# Patient Record
Sex: Female | Born: 1950 | Hispanic: Yes | Marital: Married | Smoking: Never smoker
Health system: Southern US, Community
[De-identification: ages and names within clinical notes are randomized; demographics above are authoritative.]

## PROBLEM LIST (undated history)

## (undated) DIAGNOSIS — H269 Unspecified cataract: Secondary | ICD-10-CM

## (undated) DIAGNOSIS — E079 Disorder of thyroid, unspecified: Secondary | ICD-10-CM

## (undated) DIAGNOSIS — M1990 Unspecified osteoarthritis, unspecified site: Secondary | ICD-10-CM

## (undated) DIAGNOSIS — K649 Unspecified hemorrhoids: Secondary | ICD-10-CM

## (undated) DIAGNOSIS — B009 Herpesviral infection, unspecified: Secondary | ICD-10-CM

## (undated) DIAGNOSIS — F419 Anxiety disorder, unspecified: Secondary | ICD-10-CM

## (undated) HISTORY — DX: Anxiety disorder, unspecified: F41.9

## (undated) HISTORY — DX: Unspecified hemorrhoids: K64.9

## (undated) HISTORY — DX: Herpesviral infection, unspecified: B00.9

## (undated) HISTORY — PX: OVARIAN CYST SURGERY: SHX726

## (undated) HISTORY — DX: Unspecified osteoarthritis, unspecified site: M19.90

## (undated) HISTORY — DX: Unspecified cataract: H26.9

## (undated) HISTORY — PX: HEMORRHOID SURGERY: SHX153

## (undated) HISTORY — PX: UPPER GASTROINTESTINAL ENDOSCOPY: SHX188

---

## 2018-04-05 ENCOUNTER — Encounter (HOSPITAL_COMMUNITY): Payer: Self-pay

## 2018-04-05 ENCOUNTER — Emergency Department (HOSPITAL_COMMUNITY): Payer: Self-pay

## 2018-04-05 ENCOUNTER — Emergency Department (HOSPITAL_COMMUNITY)
Admission: EM | Admit: 2018-04-05 | Discharge: 2018-04-05 | Disposition: A | Discharge status: Home / Self Care | Payer: Self-pay | Attending: Emergency Medicine | Admitting: Emergency Medicine

## 2018-04-05 DIAGNOSIS — K5792 Diverticulitis of intestine, part unspecified, without perforation or abscess without bleeding: Secondary | ICD-10-CM | POA: Insufficient documentation

## 2018-04-05 DIAGNOSIS — Z79899 Other long term (current) drug therapy: Secondary | ICD-10-CM | POA: Insufficient documentation

## 2018-04-05 DIAGNOSIS — Z7982 Long term (current) use of aspirin: Secondary | ICD-10-CM | POA: Insufficient documentation

## 2018-04-05 HISTORY — DX: Disorder of thyroid, unspecified: E07.9

## 2018-04-05 LAB — URINALYSIS, ROUTINE W REFLEX MICROSCOPIC
BACTERIA UA: NONE SEEN
BILIRUBIN URINE: NEGATIVE
Glucose, UA: NEGATIVE mg/dL
Hgb urine dipstick: NEGATIVE
Ketones, ur: NEGATIVE mg/dL
Nitrite: NEGATIVE
PROTEIN: NEGATIVE mg/dL
Specific Gravity, Urine: 1.004 — ABNORMAL LOW (ref 1.005–1.030)
pH: 6 (ref 5.0–8.0)

## 2018-04-05 LAB — COMPREHENSIVE METABOLIC PANEL
ALT: 22 U/L (ref 0–44)
AST: 24 U/L (ref 15–41)
Albumin: 3.9 g/dL (ref 3.5–5.0)
Alkaline Phosphatase: 100 U/L (ref 38–126)
Anion gap: 10 (ref 5–15)
BUN: 11 mg/dL (ref 8–23)
CHLORIDE: 102 mmol/L (ref 98–111)
CO2: 21 mmol/L — ABNORMAL LOW (ref 22–32)
Calcium: 9.1 mg/dL (ref 8.9–10.3)
Creatinine, Ser: 0.76 mg/dL (ref 0.44–1.00)
GFR calc Af Amer: >60 mL/min (ref >60)
Glucose, Bld: 108 mg/dL — ABNORMAL HIGH (ref 70–99)
POTASSIUM: 3.8 mmol/L (ref 3.5–5.1)
Sodium: 133 mmol/L — ABNORMAL LOW (ref 135–145)
Total Bilirubin: 1.2 mg/dL (ref 0.3–1.2)
Total Protein: 7.2 g/dL (ref 6.5–8.1)

## 2018-04-05 LAB — CBC
HEMATOCRIT: 44 % (ref 36.0–46.0)
HEMOGLOBIN: 14.4 g/dL (ref 12.0–15.0)
MCH: 29.7 pg (ref 26.0–34.0)
MCHC: 32.7 g/dL (ref 30.0–36.0)
MCV: 90.7 fL (ref 78.0–100.0)
Platelets: 253 10*3/uL (ref 150–400)
RBC: 4.85 MIL/uL (ref 3.87–5.11)
RDW: 12.2 % (ref 11.5–15.5)
WBC: 10.2 10*3/uL (ref 4.0–10.5)

## 2018-04-05 LAB — LIPASE, BLOOD: LIPASE: 33 U/L (ref 11–51)

## 2018-04-05 MED ORDER — CIPROFLOXACIN HCL 500 MG PO TABS
500.0000 mg | ORAL_TABLET | Freq: Once | ORAL | Status: AC
  Administered 2018-04-05: 500 mg via ORAL
  Filled 2018-04-05: qty 1

## 2018-04-05 MED ORDER — SODIUM CHLORIDE 0.9 % IV BOLUS
500.0000 mL | Freq: Once | INTRAVENOUS | Status: AC
Start: 2018-04-05 — End: 2018-04-05
  Administered 2018-04-05: 500 mL via INTRAVENOUS

## 2018-04-05 MED ORDER — METRONIDAZOLE 500 MG PO TABS: 500.0000 mg | ORAL_TABLET | Freq: Three times a day (TID) | ORAL | 0 refills | Status: AC

## 2018-04-05 MED ORDER — CIPROFLOXACIN HCL 500 MG PO TABS: 500.0000 mg | ORAL_TABLET | Freq: Two times a day (BID) | ORAL | 0 refills | Status: AC

## 2018-04-05 MED ORDER — IOHEXOL 300 MG/ML  SOLN
100.0000 mL | Freq: Once | INTRAMUSCULAR | Status: AC | PRN
  Administered 2018-04-05: 100 mL via INTRAVENOUS

## 2018-04-05 MED ORDER — TRAMADOL HCL 50 MG PO TABS: 50.0000 mg | ORAL_TABLET | Freq: Four times a day (QID) | ORAL | 0 refills | Status: DC | PRN

## 2018-04-05 MED ORDER — MORPHINE SULFATE (PF) 4 MG/ML IV SOLN
2.0000 mg | Freq: Once | INTRAVENOUS | Status: AC
  Administered 2018-04-05: 2 mg via INTRAVENOUS
  Filled 2018-04-05: qty 1

## 2018-04-05 MED ORDER — METRONIDAZOLE 500 MG PO TABS
500.0000 mg | ORAL_TABLET | Freq: Once | ORAL | Status: AC
  Administered 2018-04-05: 500 mg via ORAL
  Filled 2018-04-05: qty 1

## 2018-04-05 NOTE — ED Notes (Signed)
Patient transported to CT 

## 2018-04-05 NOTE — ED Provider Notes (Signed)
[facility] EMERGENCY DEPARTMENT Provider Note   CSN: [id number] Arrival date & time: 04/05/18  1145     History   Chief Complaint Chief Complaint  Patient presents with  . Abdominal Pain    HPI Michelle Valenzuela is a 67 y.o. female with a past medical history of thyroid disease, who presents to ED for evaluation of 2-day history of lower abdominal pain radiating to her hips and back and several episodes of diarrhea.  Also reports dysuria and "cystitis pain" for the past month.  States that she ate some eggs 2 days ago before the symptoms began and is unsure if this is what caused the diarrhea.  She reports nausea without vomiting.  States that her pain was so severe last night that she felt that she was having contractions.  She has not taken any medicine to help with her symptoms.  No sick contacts with similar symptoms.  She denies any hematochezia or melena.  Denies any chest pain, shortness of breath, fever, recent travel, hematuria, injuries or falls, numbness in legs.  HPI  Past Medical History:  Diagnosis Date  . Thyroid disease     There are no active problems to display for this patient.   Past Surgical History:  Procedure Laterality Date  . HEMORRHOID SURGERY       OB History   None      Home Medications    Prior to Admission medications   Medication Sig Start Date End Date Taking? Authorizing Provider  aspirin EC 81 MG tablet Take 81 mg by mouth daily.   Yes [provider]  bismuth subsalicylate (PEPTO BISMOL) 262 MG/15ML suspension Take 30 mLs by mouth every 6 (six) hours as needed for indigestion.   Yes [provider]  methimazole (TAPAZOLE) 10 MG tablet Take 10 mg by mouth 3 (three) times daily.   Yes [provider]  ciprofloxacin (CIPRO) 500 MG tablet Take 1 tablet (500 mg total) by mouth every 12 (twelve) hours for 7 days. 04/05/18 04/12/18  Kyona Chauncey, PA-C  metroNIDAZOLE (FLAGYL) 500 MG tablet Take 1 tablet  (500 mg total) by mouth 3 (three) times daily for 7 days. 04/05/18 04/12/18  Langley Flatley, PA-C  traMADol (ULTRAM) 50 MG tablet Take 1 tablet (50 mg total) by mouth every 6 (six) hours as needed. 04/05/18   Delia Heady, PA-C    Family History History reviewed. No pertinent family history.  Social History Social History   Tobacco Use  . Smoking status: Never Smoker  . Smokeless tobacco: Never Used  Substance Use Topics  . Alcohol use: Not on file  . Drug use: Not on file     Allergies   Patient has no known allergies.   Review of Systems Review of Systems  Constitutional: Negative for appetite change, chills and fever.  HENT: Negative for ear pain, rhinorrhea, sneezing and sore throat.   Eyes: Negative for photophobia and visual disturbance.  Respiratory: Negative for cough, chest tightness, shortness of breath and wheezing.   Cardiovascular: Negative for chest pain and palpitations.  Gastrointestinal: Positive for abdominal pain, diarrhea and nausea. Negative for blood in stool, constipation and vomiting.  Genitourinary: Positive for dysuria and flank pain. Negative for hematuria and urgency.  Musculoskeletal: Negative for myalgias.  Skin: Negative for rash.  Neurological: Negative for dizziness, weakness and light-headedness.     Physical Exam Updated Vital Signs BP 126/76   Pulse 72   Temp 98.2 F (36.8 C) (Oral)  Resp 18   SpO2 100%   Physical Exam  Constitutional: She appears well-developed and well-nourished. No distress.  HENT:  Head: Normocephalic and atraumatic.  Nose: Nose normal.  Eyes: Conjunctivae and EOM are normal. Left eye exhibits no discharge. No scleral icterus.  Neck: Normal range of motion. Neck supple.  Cardiovascular: Normal rate, regular rhythm, normal heart sounds and intact distal pulses. Exam reveals no gallop and no friction rub.  No murmur heard. Pulmonary/Chest: Effort normal and breath sounds normal. No respiratory distress.    Abdominal: Soft. Bowel sounds are normal. She exhibits no distension. There is tenderness in the suprapubic area. There is no rebound, no guarding and no CVA tenderness.  No CVA tenderness bilaterally.  Musculoskeletal: Normal range of motion. She exhibits no edema.  Neurological: She is alert. She exhibits normal muscle tone. Coordination normal.  Skin: Skin is warm and dry. No rash noted.  Psychiatric: She has a normal mood and affect.  Nursing note and vitals reviewed.    ED Treatments / Results  Labs (all labs ordered are listed, but only abnormal results are displayed) Labs Reviewed  COMPREHENSIVE METABOLIC PANEL - Abnormal; Notable for the following components:      Result Value   Sodium 133 (*)    CO2 21 (*)    Glucose, Bld 108 (*)    All other components within normal limits  URINALYSIS, ROUTINE W REFLEX MICROSCOPIC - Abnormal; Notable for the following components:   Color, Urine STRAW (*)    Specific Gravity, Urine 1.004 (*)    Leukocytes, UA TRACE (*)    All other components within normal limits  URINE CULTURE  LIPASE, BLOOD  CBC    EKG None  Radiology Ct Abdomen Pelvis W Contrast  Result Date: 04/05/2018 CLINICAL DATA:  Abdominal pain and diarrhea for the past 2 days. EXAM: CT ABDOMEN AND PELVIS WITH CONTRAST TECHNIQUE: Multidetector CT imaging of the abdomen and pelvis was performed using the standard protocol following bolus administration of intravenous contrast. CONTRAST:  124mL OMNIPAQUE IOHEXOL 300 MG/ML  SOLN COMPARISON:  None. FINDINGS: Lower chest: Limited visualization of lower thorax is negative for focal airspace opacity or pleural effusion. Normal heart size.  No pericardial effusion. Hepatobiliary: Normal hepatic contour. Note is made of an approximately 1 cm hypoattenuating (9 Hounsfield unit) cyst within the dome of the right lobe of the liver (image 13, series 3). Additional subcentimeter hypoattenuating renal lesions are too small to accurately  characterize though favored to represent additional hepatic cysts. Normal appearance of the gallbladder given degree distention. No radiopaque gallstones. No intra extrahepatic bili duct dilatation. No ascites. Pancreas: Normal appearance of the pancreas Spleen: Normal appearance of the spleen Adrenals/Urinary Tract: There is symmetric enhancement and excretion of the bilateral kidneys. There is geographic atrophy involving the posterior inferior aspect of the right kidney (image 35, series 3), potentially the sequela of prior infection (favored) and/or ischemia. The bilateral renal arteries appear patent without evidence of vessel irregularity to suggest FMD on this non CTA examination. Note is made of an approximately 1.1 cm hypoattenuating nonenhancing right-sided renal cyst. Punctate (approximately 0.9 cm) partially exophytic hypoattenuating lesion arising from the inferior pole the left kidney is too small to accurately characterize though favored to represent a left-sided renal cyst. No definite renal stones on this postcontrast examination. No urine obstruction or perinephric stranding. Normal appearance the bilateral adrenal glands. Normal appearance of the urinary bladder given degree of distention. Stomach/Bowel: Scattered diverticulosis. There is ill-defined stranding about several  different diverticuli involving the descending colon within the left mid hemiabdomen (representative images 37 and 39, series 3) suggestive of acute diverticulitis. No evidence of perforation or definable/drainable fluid collection. Scattered high-density intraluminal material is seen within the mid small bowel without evidence of enteric obstruction. Normal appearance of the terminal ileum and appendix. Moderate colonic stool burden without evidence of enteric obstruction. No pneumoperitoneum, pneumatosis or portal venous gas. Vascular/Lymphatic: Scattered calcified atherosclerotic plaque within a tortuous but normal caliber  abdominal aorta. The major branch vessels of the abdominal aorta appear patent on this non CTA examination. No bulky retroperitoneal, mesenteric, pelvic or inguinal lymphadenopathy Reproductive: Normal appearance of the pelvic organs. No discrete adnexal lesion. Other: Regional soft tissues appear normal. Musculoskeletal: No acute or aggressive osseous abnormalities. Mild multilevel lumbar spine DDD, worse at L5-S1 with disc space height loss, endplate irregularity and sclerosis. Mild degenerative change of the pubic symphysis. IMPRESSION: 1. Suspected acute uncomplicated diverticulitis involving the descending colon within the left mid hemiabdomen. No evidence of perforation or definable/drainable fluid collection. If not recently performed, further evaluation with colonoscopy after the resolution of acute symptoms is advised. 2.  Aortic Atherosclerosis (ICD10-I70.0). Electronically Signed   By: Sandi Mariscal M.D.   On: 04/05/2018 14:18    Procedures Procedures (including critical care time)  Medications Ordered in ED Medications  morphine 4 MG/ML injection 2 mg (2 mg Intravenous Given 04/05/18 1255)  sodium chloride 0.9 % bolus 500 mL (0 mLs Intravenous Stopped 04/05/18 1429)  iohexol (OMNIPAQUE) 300 MG/ML solution 100 mL (100 mLs Intravenous Contrast Given 04/05/18 1349)  ciprofloxacin (CIPRO) tablet 500 mg (500 mg Oral Given 04/05/18 1544)  metroNIDAZOLE (FLAGYL) tablet 500 mg (500 mg Oral Given 04/05/18 1544)     Initial Impression / Assessment and Plan / ED Course  I have reviewed the triage vital signs and the nursing notes.  Pertinent labs & imaging results that were available during my care of the patient were reviewed by me and considered in my medical decision making (see chart for details).     67 year old female presents for 2-day history of lower abdominal pain radiating to hips and several episodes of diarrhea daily.  She reports dysuria for the past month.  Unsure if this was caused by the  meal that she ate 2 days ago.  Reports nausea without vomiting.  Has not taken any medicine help with her symptoms.  Denies any blood in stool, chest pain, shortness of breath, fever.  On physical exam she has tenderness palpation of the mid lower abdomen with no rebound or guarding noted.  She is afebrile.  Lab work significant for unremarkable CBC, CMP, lipase, urinalysis.  CT shows uncomplicated diverticulitis.  Will treat with ciprofloxacin and Flagyl and as needed tramadol.  Advised to follow-up with PCP and to return to ED for any severe worsening symptoms. Patient discussed with and seen by my attending, Dr. Jeanell Sparrow.  Portions of this note were generated with Lobbyist. Dictation errors may occur despite best attempts at proofreading.   Final Clinical Impressions(s) / ED Diagnoses   Final diagnoses:  Diverticulitis    ED Discharge Orders        Ordered    metroNIDAZOLE (FLAGYL) 500 MG tablet  3 times daily     04/05/18 1655    ciprofloxacin (CIPRO) 500 MG tablet  Every 12 hours     04/05/18 1655    traMADol (ULTRAM) 50 MG tablet  Every 6 hours PRN     04/05/18  [place], Ogden Handlin, PA-C 04/05/18 1727    Pattricia Boss, MD 04/05/18 2149

## 2018-04-05 NOTE — Discharge Instructions (Signed)
Take ciprofloxacin and Flagyl to help with the diverticulitis.  Please complete the entire course of this medication regardless of symptom improvement to prevent worsening or recurrence of your infection. Follow-up with your primary care provider for further evaluation. Return to ED for worsening symptoms, severe abdominal pain, blood in vomit or stool, chest pain or shortness of breath.  Tome ciprofloxacino y Flagyl para ayudar con la diverticulitis.  Por favor complete todo el curso de este medicamento independientemente de la mejora de los sntomas para evitar el empeoramiento o la recurrencia de su infeccin. Seguimiento con su proveedor de atencin primaria para Educational psychologist. Regrese a la disfuncin ala f. para empeorar los sntomas, dolor abdominal intenso, sangre en el vmito o las heces, dolor en el pecho o dificultad para Ambulance person.

## 2018-04-05 NOTE — ED Notes (Signed)
Pt ambulated to the restroom with out difficulty.

## 2018-04-05 NOTE — ED Triage Notes (Signed)
Pt presents with 2 day h/o abdominal pain and diarrhea.  Pt reports nausea, reports 1 month h/o dysuria.

## 2018-04-07 LAB — URINE CULTURE

## 2018-05-02 ENCOUNTER — Encounter: Payer: Self-pay | Admitting: Nurse Practitioner

## 2018-05-02 ENCOUNTER — Ambulatory Visit: Payer: Self-pay | Admitting: Nurse Practitioner

## 2018-05-02 ENCOUNTER — Ambulatory Visit: Payer: Self-pay | Attending: Nurse Practitioner | Admitting: Nurse Practitioner

## 2018-05-02 VITALS — BP 135/83 | HR 81 | Temp 98.3°F | Ht 60.0 in | Wt 165.4 lb

## 2018-05-02 DIAGNOSIS — R42 Dizziness and giddiness: Secondary | ICD-10-CM | POA: Insufficient documentation

## 2018-05-02 DIAGNOSIS — Z8719 Personal history of other diseases of the digestive system: Secondary | ICD-10-CM | POA: Insufficient documentation

## 2018-05-02 DIAGNOSIS — H9202 Otalgia, left ear: Secondary | ICD-10-CM | POA: Insufficient documentation

## 2018-05-02 DIAGNOSIS — K5792 Diverticulitis of intestine, part unspecified, without perforation or abscess without bleeding: Secondary | ICD-10-CM

## 2018-05-02 DIAGNOSIS — E871 Hypo-osmolality and hyponatremia: Secondary | ICD-10-CM | POA: Insufficient documentation

## 2018-05-02 DIAGNOSIS — Z7982 Long term (current) use of aspirin: Secondary | ICD-10-CM | POA: Insufficient documentation

## 2018-05-02 DIAGNOSIS — N760 Acute vaginitis: Secondary | ICD-10-CM | POA: Insufficient documentation

## 2018-05-02 DIAGNOSIS — Z0001 Encounter for general adult medical examination with abnormal findings: Secondary | ICD-10-CM | POA: Insufficient documentation

## 2018-05-02 DIAGNOSIS — E0590 Thyrotoxicosis, unspecified without thyrotoxic crisis or storm: Secondary | ICD-10-CM | POA: Insufficient documentation

## 2018-05-02 DIAGNOSIS — Z0000 Encounter for general adult medical examination without abnormal findings: Secondary | ICD-10-CM

## 2018-05-02 MED ORDER — MECLIZINE HCL 25 MG PO TABS: 25.0000 mg | ORAL_TABLET | Freq: Three times a day (TID) | ORAL | 0 refills | Status: DC | PRN

## 2018-05-02 MED ORDER — METHIMAZOLE 10 MG PO TABS: 10.0000 mg | ORAL_TABLET | Freq: Three times a day (TID) | ORAL | 3 refills | Status: DC

## 2018-05-02 MED ORDER — SULFAMETHOXAZOLE-TRIMETHOPRIM 800-160 MG PO TABS: 1.0000 | ORAL_TABLET | Freq: Two times a day (BID) | ORAL | 0 refills | Status: AC

## 2018-05-02 MED ORDER — SULFAMETHOXAZOLE-TRIMETHOPRIM 800-160 MG PO TABS
1.0000 | ORAL_TABLET | Freq: Two times a day (BID) | ORAL | 0 refills | Status: DC
Start: 2018-05-02 — End: 2018-05-02

## 2018-05-02 MED ORDER — FLUTICASONE PROPIONATE 50 MCG/ACT NA SUSP: 2.0000 | Freq: Every day | NASAL | 2 refills | Status: DC

## 2018-05-02 MED ORDER — METHIMAZOLE 10 MG PO TABS: 10.0000 mg | ORAL_TABLET | Freq: Three times a day (TID) | ORAL | 1 refills | Status: DC

## 2018-05-02 NOTE — Patient Instructions (Addendum)
I have instructed you to go to the Emergency for your vaginal abscess. This could be related to a MRSA infection and could turn into a very serious condition. However as you have declined my recommendations I will have you follow up with me in 7-10 days.  Laberintitis (Labyrinthitis) La laberintitis es una infeccin del odo interno. El odo interno es un sistema formado por conductos y cavidades que est lleno de lquido (laberinto). Las neuronas del odo interno envan seales al cerebro para la audicin y el equilibrio. Cuando en los conductos y canales se introducen microbios diminutos (microorganismos), estos daan las neuronas que envan mensajes al cerebro. La laberintitis puede causar alteraciones en la audicin y el equilibrio. La mayora de los casos de laberintitis se presentan de Geographical information systems officer repentina y se resuelven en el trmino de Corporate treasurer. Si la infeccin daa partes del laberinto, algunos sntomas Geneticist, molecular (laberintitis crnica). CAUSAS Los virus son la causa ms frecuente de la laberintitis. Los virus que proliferan en el laberinto son los mismos que causan otras enfermedades, por ejemplo:  Mononucleosis.  Sarampin.  Gripe.  Herpes. Las bacterias tambin pueden causar laberintitis cuando se propagan al laberinto desde una infeccin en el cerebro o el odo medio. Las bacterias pueden causar lo siguiente:  Laberintitis serosa. Este tipo se desarrolla cuando las bacterias producen una sustancia txica que penetra en el laberinto.  Laberintitis purulenta. Este tipo se desarrolla cuando las bacterias penetran en el laberinto. [place] a un riesgo mayor de tener laberintitis si:  Bebe mucho alcohol.  Fuma.  Toma determinados medicamentos.  No descansa bien (est fatigado).  Est bajo mucho estrs.  Tener alergias.  Recientemente, tuvo una infeccin en la nariz o la garganta (infeccin de las vas respiratorias altas) o una  infeccin en el odo. SNTOMAS Generalmente, los sntomas de laberintitis comienzan de [place] repentina y pueden ser leves o intensos e incluir lo siguiente:  Tree surgeon.  Prdida auditiva.  La sensacin de que se est moviendo mientras est quieto (vrtigo).  Ruidos en el odo (tinnitus).  Nuseas y vmitos.  Dificultad para enfocar los ojos. Los sntomas de laberintitis crnica pueden incluir lo siguiente:  Fatiga.  Confusin.  Prdida auditiva.  Tinnitus.  Falta de equilibrio.  Vrtigo despus de Optometrist movimientos bruscos con la cabeza. DIAGNSTICO El mdico puede sospechar la presencia de laberintitis si repentinamente usted se siente mareado y pierde la audicin, en especial si ha tenido una infeccin reciente de las vas respiratorias altas. El Special educational needs teacher un examen fsico para lo siguiente:  Hydrographic surveyor una infeccin en los odos.  Hacerle una prueba de equilibrio.  Controlar sus movimientos oculares. El mdico puede hacer varios estudios para descartar otras causas para los sntomas y ayudar a Nurse, children's laberintitis. Estos pueden incluir lo siguiente:  Estudios de diagnstico por imgenes, como una tomografa computarizada (TC) o una resonancia magntica (RM), en busca de otras causas para los sntomas.  Pruebas de audicin.  Una electronistagmografa (ENG) para controlar su equilibrio. Vivian laberintitis depende de la causa. Si es viral, puede mejorar sin tratamiento. Si es [place], tal vez deba tomar medicamentos para combatir la infeccin (antibiticos). [place], tal vez reciba tratamiento para aliviar los sntomas de la laberintitis. Los tratamientos pueden incluir lo siguiente:  Medicamentos para: ? Detener los [employer]. ? [place] ? Tratar la zona inflamada. ? Acelerar la recuperacin.  Hacer reposo en la cama hasta que desaparezcan los [place].  Recibir lquidos por  va intravenosa. Es posible que  necesite recibir tratamiento si hay escasez de lquido en su organismo (se deshidrata) debido a las nuseas y los vmitos permanentes. [place] los medicamentos solamente como se lo haya indicado el mdico.  Si le recetaron antibiticos, asegrese de terminarlos, incluso si comienza a sentirse mejor.  Descanse todo lo que pueda.  Evite los ruidos fuertes y las luces brillantes.  No haga movimientos repentinos hasta no tener ms mareos.  No conduzca hasta que el mdico lo autorice.  Beba suficiente lquido para Consulting civil engineer orina clara o de color amarillo plido.  Trabaje con un fisioterapeuta si sigue estando mareado despus de [employer]. El terapeuta puede ensearle ejercicios para ayudarlo a que se adapte a la sensacin de Chief of Staff (ejercicios de rehabilitacin vestibular).  Concurra a todas las visitas de control como se lo haya indicado el mdico. Esto es importante. SOLICITE ATENCIN MDICA SI:  Los medicamentos no [employer].  Los sntomas se prolongan durante ms de [employer].  Tiene fiebre. SOLICITE ATENCIN MDICA DE INMEDIATO SI:  Se siente muy mareado.  Tiene nuseas o vmitos que no desaparecen.  Hay un deterioro muy rpido de la audicin. Esta informacin no tiene Marine scientist el consejo del mdico. Asegrese de hacerle al mdico cualquier pregunta que tenga. Document Released: 10/08/2014 Document Revised: 10/08/2014 Document Reviewed: 05/19/2014 Elsevier Interactive Patient Education  2018 [place] (Dizziness) Los mareos son un problema muy frecuente. Causan sensacin de inestabilidad o de desvanecimiento. Puede sentir que se va a desmayar. Un mareo puede provocarle una lesin si se tropieza o se cae. Cualquier persona puede marearse, pero los [place] son ms frecuentes en los [employer]. Esta afeccin puede tener muchas causas, por  ejemplo:  Medicamentos.  Deshidratacin.  Enfermedad. CUIDADOS EN EL HOGAR Estas indicaciones pueden ayudarlo con el trastorno: Comida y bebida  Beba suficiente lquido para Theatre manager el pis (orina) claro o de color amarillo plido. Esto evita la deshidratacin. Trate de beber ms lquidos transparentes, como agua.  No beba alcohol.  Limite la cantidad de cafena que bebe o come si el mdico se lo indic.  Limite la cantidad de sal que bebe o come si el mdico se lo indic. Actividad  Evite los movimientos rpidos. ? Cuando se levante de una silla, sujtese hasta sentirse bien. ? Por la maana, sintese primero a un lado de la cama. Cuando se sienta bien, pngase lentamente de pie mientras se sostiene de algo, hasta que sepa que ha logrado el equilibrio.  Mueva las piernas con frecuencia si debe estar de pie en un lugar durante mucho tiempo. Mientras est de pie, contraiga y relaje los msculos de las piernas.  No conduzca vehculos ni utilice maquinarias pesadas si se siente mareado.  Evite agacharse si se siente mareado. En su casa, coloque los objetos de modo que le resulte fcil alcanzarlos sin Office manager. Estilo de vida  No consuma ningn producto que contenga tabaco, lo que incluye cigarrillos, tabaco de Higher education careers adviser o Psychologist, sport and exercise. Si necesita ayuda para dejar de fumar, consulte al [employer].  Trate de reducir el nivel de estrs practicando actividades como el yoga o la meditacin. Hable con el mdico si necesita ayuda. Instrucciones generales  Controle sus mareos para ver si hay cambios.  Tome los medicamentos solamente como se lo haya indicado el mdico. Hable con el mdico si cree que algn medicamento que est tomando es la causa de sus [place].  Infrmele  a un amigo o a un familiar si se siente mareado. Pdale a esta persona que llame al mdico si observa cambios en su comportamiento.  Concurra a todas las visitas de control como se lo haya indicado el mdico.  Esto es importante. SOLICITE AYUDA SI:  Los [employer].  Los [employer] o la sensacin de Engineer, petroleum.  Siente malestar estomacal (nuseas).  Tiene problemas para escuchar.  Aparecen nuevos sntomas.  Cuando est de pie se siente inestable o que la habitacin da vueltas.  SOLICITE AYUDA DE INMEDIATO SI:  [place] o tiene diarrea y no puede comer ni beber nada.  Tiene dificultad para lo siguiente: ? Hablar. ? Caminar. ? [place]agar. ? Usar los brazos, las [place] piernas.  Siente una debilidad generalizada.  No piensa con claridad o tiene dificultades para armar oraciones. Es posible que un amigo o un familiar adviertan que esto ocurre.  Tiene los siguientes sntomas: ? Tourist information centre manager. ? Dolor en el vientre (abdomen). ? Falta de aire. ? Sudoracin.  Cambios en la visin.  Hemorragias.  Dolores de [place].  Dolor o rigidez en el cuello.  Cristy Hilts.  Esta informacin no tiene Marine scientist el consejo del mdico. Asegrese de hacerle al mdico cualquier pregunta que tenga. Document Released: 09/06/2011 Document Revised: 02/01/2015 Document Reviewed: 09/13/2014 Elsevier Interactive Patient Education  2017 [employer].

## 2018-05-02 NOTE — Progress Notes (Signed)
Assessment & Plan:  Michelle Valenzuela was seen today for new patient (initial visit).  Diagnoses and all orders for this visit:  Diverticulitis RESOLVED. STABLE  Hyponatremia -     CMP14+EGFR  Hyperthyroidism -     T4, free -     T3 -     TSH -     methimazole (TAPAZOLE) 10 MG tablet; Take 1 tablet (10 mg total) by mouth 3 (three) times daily.  Routine adult health maintenance -     Hepatitis C Antibody  Abscess, vagina -     sulfamethoxazole-trimethoprim (BACTRIM DS,SEPTRA DS) 800-160 MG tablet; Take 1 tablet by mouth 2 (two) times daily for 10 days.  Dizziness -     meclizine (ANTIVERT) 25 MG tablet; Take 1 tablet (25 mg total) by mouth 3 (three) times daily as needed for dizziness. -     fluticasone (FLONASE) 50 MCG/ACT nasal spray; Place 2 sprays into both nostrils daily.   Patient has been counseled on age-appropriate routine health concerns for screening and prevention. These are reviewed and up-to-date. Referrals have been placed accordingly. Immunizations are up-to-date or declined.    Subjective:   Chief Complaint  Patient presents with  . New Patient (Initial Visit)    Patient is here to establish care for thyroid.    HPI Michelle Valenzuela 67 y.o. female presents to office today to establish care. VRI was used to communicate directly with patient for the entire encounter including providing detailed patient instructions. She is uninsured and will need to see the financial counselor in order to obtain financial assistance to be referred to additional specialists.    Vaginal Abscess She has complaints of a boil in her vagina. She states she after she was discharged from the ED on 04-08-2018 she had intense itching in her rectum which spread to her vagina. She then began to notice small pimples on her vagina with one in particular on the top of her vagina which continued to increase in size and became more painful. She has been applying warm compresses to the area and has noticed  drainage as well. She does not have a history of abscesses or any GU history. She has been taking her husband's keflex with some improvement of the affected area.    Diverticulitis She was recently seen in the ED on 04-05-2018 with diverticulitis (no perforation or abscess) and treated with Cipro and Flagyl.  She has never had a colonoscopy. She will need to see a Copywriter, advertising. She denies any abdominal pain, nausea or vomiting currently   Hyperthyroidism She has a history of hyperthyroidism which was being managed by a provider in [place]. She had a thyroid cyst several years ago which was not removed. She states she was placed on tapazole but does recall the reason she was placed on this medicine. She will need to see an endocrinologist as well. The patient denies URI symptoms, tender neck / sore throat, pheochromocytoma. Prior studies include unknown. She denies Symptoms include tachycardia, heat intolerance, irritability, jitteriness, restlessness, poor sleeping, weight loss, palpitations, diarrhea, increased stool frequency, eye pain, neck pain, photophobia, hair loss.     Dizziness She complains of dizziness .  The dizziness has been present for 4 weeks. The patient describes the symptoms as disequalibirum and vertigo. Symptoms are exacerbated by rapid head movements The patient also complains of otalgia left. Patient denies aural pressure otorrhea tinnitus hearing loss.  She has been treated with OTC dramamine with fair improvement.  Previous work up has  been NONE.    Review of Systems  Constitutional: Negative for fever, malaise/fatigue and weight loss.  HENT: Negative.  Negative for nosebleeds.   Eyes: Negative.  Negative for blurred vision, double vision and photophobia.  Respiratory: Negative.  Negative for cough and shortness of breath.   Cardiovascular: Negative.  Negative for chest pain, palpitations and leg swelling.  Gastrointestinal: Negative.  Negative for heartburn,  nausea and vomiting.  Musculoskeletal: Negative.  Negative for myalgias.  Neurological: Positive for dizziness. Negative for focal weakness, seizures and headaches.  Psychiatric/Behavioral: Negative.  Negative for suicidal ideas.    Past Medical History:  Diagnosis Date  . Thyroid disease     Past Surgical History:  Procedure Laterality Date  . HEMORRHOID SURGERY    . OVARIAN CYST SURGERY      Family History  Problem Relation Age of Onset  . Diabetes Neg Hx   . Hypertension Neg Hx     Social History Reviewed with no changes to be made today.   Outpatient Medications Prior to Visit  Medication Sig Dispense Refill  . aspirin EC 81 MG tablet Take 81 mg by mouth daily.    . methimazole (TAPAZOLE) 10 MG tablet Take 10 mg by mouth 3 (three) times daily.    Marland Kitchen bismuth subsalicylate (PEPTO BISMOL) 262 MG/15ML suspension Take 30 mLs by mouth every 6 (six) hours as needed for indigestion.    . traMADol (ULTRAM) 50 MG tablet Take 1 tablet (50 mg total) by mouth every 6 (six) hours as needed. (Patient not taking: Reported on 05/02/2018) 8 tablet 0   No facility-administered medications prior to visit.     No Known Allergies     Objective:    BP 135/83 (BP Location: Left Arm, Patient Position: Sitting, Cuff Size: Large)   Pulse 81   Temp 98.3 F (36.8 C) (Oral)   Ht 5' (1.524 m)   Wt 165 lb 6.4 oz (75 kg)   SpO2 99%   BMI 32.30 kg/m  Wt Readings from Last 3 Encounters:  05/02/18 165 lb 6.4 oz (75 kg)    Physical Exam  Constitutional: She is oriented to person, place, and time. She appears well-developed and well-nourished. She is cooperative.  HENT:  Head: Normocephalic and atraumatic.  Eyes: EOM are normal.  Neck: Normal range of motion.  Cardiovascular: Normal rate, regular rhythm, normal heart sounds and intact distal pulses. Exam reveals no gallop and no friction rub.  No murmur heard. Pulmonary/Chest: Effort normal and breath sounds normal. No tachypnea. No  respiratory distress. She has no decreased breath sounds. She has no wheezes. She has no rhonchi. She has no rales. She exhibits no tenderness.  Abdominal: Bowel sounds are normal.  Genitourinary:    There is rash and tenderness on the right labia. There is tenderness in the vagina.  Musculoskeletal: Normal range of motion. She exhibits no edema.  Neurological: She is alert and oriented to person, place, and time. She displays a negative Romberg sign. Coordination and gait normal.  Skin: Skin is warm and dry.  Psychiatric: She has a normal mood and affect. Her behavior is normal. Judgment and thought content normal.  Nursing note and vitals reviewed.      Patient has been counseled extensively about nutrition and exercise as well as the importance of adherence with medications and regular follow-up. The patient was given clear instructions to go to ER or return to medical center if symptoms don't improve, worsen or new problems develop. The patient verbalized  understanding.   Follow-up: Return in about 10 days (around 05/12/2018) for GU issue; .   Total time spent with patient was >62mn . Greater than 50 % of this visit was spent face to face counseling and coordinating care regarding risk factor modification, compliance importance and encouragement, education related to her vaginal abscess for which she was strongly encouraged to seek treatment (I&D) of abscess in the ED however she adamantly declined.  ZGildardo Pounds FNP-BC [facility] [place]  05/02/2018, 5:03 PM

## 2018-05-03 LAB — TSH: TSH: 0.933 u[IU]/mL (ref 0.450–4.500)

## 2018-05-03 LAB — CMP14+EGFR
A/G RATIO: 1.6 (ref 1.2–2.2)
ALBUMIN: 4.4 g/dL (ref 3.6–4.8)
ALT: 15 IU/L (ref 0–32)
AST: 18 IU/L (ref 0–40)
Alkaline Phosphatase: 103 IU/L (ref 39–117)
BUN / CREAT RATIO: 25 (ref 12–28)
BUN: 18 mg/dL (ref 8–27)
Bilirubin Total: 0.4 mg/dL (ref 0.0–1.2)
CO2: 23 mmol/L (ref 20–29)
Calcium: 10.1 mg/dL (ref 8.7–10.3)
Chloride: 107 mmol/L — ABNORMAL HIGH (ref 96–106)
Creatinine, Ser: 0.73 mg/dL (ref 0.57–1.00)
GFR, EST AFRICAN AMERICAN: 99 mL/min/{1.73_m2} (ref >59)
GFR, EST NON AFRICAN AMERICAN: 85 mL/min/{1.73_m2} (ref >59)
GLOBULIN, TOTAL: 2.8 g/dL (ref 1.5–4.5)
Glucose: 93 mg/dL (ref 65–99)
POTASSIUM: 4.3 mmol/L (ref 3.5–5.2)
SODIUM: 144 mmol/L (ref 134–144)
Total Protein: 7.2 g/dL (ref 6.0–8.5)

## 2018-05-03 LAB — HEPATITIS C ANTIBODY: Hep C Virus Ab: <0.1 s/co ratio (ref 0.0–0.9)

## 2018-05-03 LAB — T4, FREE: Free T4: 1.55 ng/dL (ref 0.82–1.77)

## 2018-05-03 LAB — T3: T3 TOTAL: 137 ng/dL (ref 71–180)

## 2018-05-06 ENCOUNTER — Other Ambulatory Visit (HOSPITAL_COMMUNITY): Payer: Self-pay | Admitting: *Deleted

## 2018-05-06 ENCOUNTER — Telehealth: Payer: Self-pay | Admitting: *Deleted

## 2018-05-06 DIAGNOSIS — N644 Mastodynia: Secondary | ICD-10-CM

## 2018-05-06 NOTE — Telephone Encounter (Signed)
Left message on voicemail to return call. Interpreter services provided by Nicole Kindred with [employer], [phone]   Notes recorded by Gildardo Pounds, NP on 05/06/2018 at 9:57 AM EDT Thyroid levels are normal. Hep C is negative

## 2018-05-08 NOTE — Telephone Encounter (Signed)
CMA attempt to call patient to inform on results.  No answer and left a VM for patient.  If patient cal back, please inform:  Thyroid levels are normal. Hep C is negative

## 2018-05-12 ENCOUNTER — Encounter: Payer: Self-pay | Admitting: Nurse Practitioner

## 2018-05-12 ENCOUNTER — Ambulatory Visit: Payer: Self-pay | Attending: Nurse Practitioner | Admitting: Nurse Practitioner

## 2018-05-12 VITALS — BP 129/84 | HR 74 | Temp 98.8°F | Resp 12 | Wt 169.8 lb

## 2018-05-12 DIAGNOSIS — E079 Disorder of thyroid, unspecified: Secondary | ICD-10-CM | POA: Insufficient documentation

## 2018-05-12 DIAGNOSIS — B373 Candidiasis of vulva and vagina: Secondary | ICD-10-CM

## 2018-05-12 DIAGNOSIS — R300 Dysuria: Secondary | ICD-10-CM

## 2018-05-12 DIAGNOSIS — B3731 Acute candidiasis of vulva and vagina: Secondary | ICD-10-CM

## 2018-05-12 DIAGNOSIS — Z7982 Long term (current) use of aspirin: Secondary | ICD-10-CM | POA: Insufficient documentation

## 2018-05-12 LAB — POCT URINALYSIS DIPSTICK
GLUCOSE UA: NEGATIVE
Ketones, UA: NEGATIVE
Nitrite, UA: NEGATIVE
Protein, UA: NEGATIVE
RBC UA: NEGATIVE
UROBILINOGEN UA: 0.2 U/dL
pH, UA: 5.5 (ref 5.0–8.0)

## 2018-05-12 MED ORDER — NYSTATIN 100000 UNIT/GM EX POWD: Freq: Three times a day (TID) | CUTANEOUS | 0 refills | Status: DC

## 2018-05-12 MED ORDER — FLUCONAZOLE 150 MG PO TABS: 150.0000 mg | ORAL_TABLET | ORAL | 0 refills | Status: AC

## 2018-05-12 NOTE — Progress Notes (Signed)
Dysuria after completing ATB

## 2018-05-12 NOTE — Progress Notes (Signed)
Assessment & Plan:  Michelle Valenzuela was seen today for dysuria.  Diagnoses and all orders for this visit:  Vagina, candidiasis -     fluconazole (DIFLUCAN) 150 MG tablet; Take 1 tablet (150 mg total) by mouth every 3 (three) days for 3 doses. -     nystatin (MYCOSTATIN/NYSTOP) powder; Apply topically 3 (three) times daily.  Dysuria -     Urinalysis Dipstick -     Urinalysis, Complete -     CULTURE, URINE COMPREHENSIVE -     Microscopic Examination    Patient has been counseled on age-appropriate routine health concerns for screening and prevention. These are reviewed and up-to-date. Referrals have been placed accordingly. Immunizations are up-to-date or declined.    Subjective:   Chief Complaint  Patient presents with  . Dysuria   HPI Michelle Valenzuela 67 y.o. female presents to office today for follow up to Vaginal abscess. VRI was used to communicate directly with patient for the entire encounter including providing detailed patient instructions.    Abscess: Patient presents for evaluation of a cutaneous abscess. Lesion has greatly improved. Patient reports the abscess started drainage a few days after she started taking the antibiotics. Area is no longer tender, swollen or erythematous. She does however have complaints of dysuria and pain in the right groin fold. Dysuria has been present for the past few days. There are no other GU symptoms.     Review of Systems  Constitutional: Negative for fever, malaise/fatigue and weight loss.  Respiratory: Negative.  Negative for cough and shortness of breath.   Cardiovascular: Negative.  Negative for chest pain, palpitations and leg swelling.  Gastrointestinal: Negative.  Negative for heartburn, nausea and vomiting.  Genitourinary: Positive for dysuria. Negative for flank pain, frequency and urgency.  Musculoskeletal: Negative.  Negative for myalgias.  Skin: Positive for rash.  Neurological: Negative.  Negative for dizziness, focal weakness,  seizures and headaches.  Psychiatric/Behavioral: Negative.  Negative for suicidal ideas.    Past Medical History:  Diagnosis Date  . Thyroid disease     Past Surgical History:  Procedure Laterality Date  . HEMORRHOID SURGERY    . OVARIAN CYST SURGERY      Family History  Problem Relation Age of Onset  . Diabetes Neg Hx   . Hypertension Neg Hx     Social History Reviewed with no changes to be made today.   Outpatient Medications Prior to Visit  Medication Sig Dispense Refill  . aspirin EC 81 MG tablet Take 81 mg by mouth daily.    Marland Kitchen bismuth subsalicylate (PEPTO BISMOL) 262 MG/15ML suspension Take 30 mLs by mouth every 6 (six) hours as needed for indigestion.    . fluticasone (FLONASE) 50 MCG/ACT nasal spray Place 2 sprays into both nostrils daily. 16 g 2  . meclizine (ANTIVERT) 25 MG tablet Take 1 tablet (25 mg total) by mouth 3 (three) times daily as needed for dizziness. 60 tablet 0  . methimazole (TAPAZOLE) 10 MG tablet Take 1 tablet (10 mg total) by mouth 3 (three) times daily. 90 tablet 3  . sulfamethoxazole-trimethoprim (BACTRIM DS,SEPTRA DS) 800-160 MG tablet Take 1 tablet by mouth 2 (two) times daily for 10 days. 20 tablet 0   No facility-administered medications prior to visit.     No Known Allergies     Objective:    BP 129/84 (BP Location: Left Arm, Patient Position: Sitting, Cuff Size: Normal)   Pulse 74   Temp 98.8 F (37.1 C) (Oral)   Resp  12   Wt 169 lb 12.8 oz (77 kg)   SpO2 100%   BMI 33.16 kg/m  Wt Readings from Last 3 Encounters:  05/12/18 169 lb 12.8 oz (77 kg)  05/02/18 165 lb 6.4 oz (75 kg)    Physical Exam  Constitutional: She is oriented to person, place, and time. She appears well-developed and well-nourished. She is cooperative.  HENT:  Head: Normocephalic and atraumatic.  Cardiovascular: Normal rate, regular rhythm and normal heart sounds. Exam reveals no gallop and no friction rub.  No murmur heard. Pulmonary/Chest: Effort normal  and breath sounds normal. No tachypnea. No respiratory distress. She has no decreased breath sounds. She has no wheezes. She has no rhonchi. She has no rales. She exhibits no tenderness.  Abdominal: Soft. Bowel sounds are normal.  Genitourinary:    There is rash on the right labia.  Musculoskeletal: Normal range of motion. She exhibits no edema.  Neurological: She is alert and oriented to person, place, and time. Coordination normal.  Skin: Skin is warm and dry.  Psychiatric: She has a normal mood and affect. Her behavior is normal. Judgment and thought content normal.  Nursing note and vitals reviewed.        Patient has been counseled extensively about nutrition and exercise as well as the importance of adherence with medications and regular follow-up. The patient was given clear instructions to go to ER or return to medical center if symptoms don't improve, worsen or new problems develop. The patient verbalized understanding.   Follow-up: Return in about 3 months (around 08/12/2018) for TSH.   Gildardo Pounds, FNP-BC [facility] and [facility], [place]   05/17/2018, 8:27 PM

## 2018-05-13 LAB — MICROSCOPIC EXAMINATION
BACTERIA UA: NONE SEEN
Casts: NONE SEEN /lpf

## 2018-05-13 LAB — URINALYSIS, COMPLETE
BILIRUBIN UA: NEGATIVE
GLUCOSE, UA: NEGATIVE
KETONES UA: NEGATIVE
Leukocytes, UA: NEGATIVE
NITRITE UA: NEGATIVE
Protein, UA: NEGATIVE
RBC UA: NEGATIVE
SPEC GRAV UA: 1.018 (ref 1.005–1.030)
UUROB: 0.2 mg/dL (ref 0.2–1.0)
pH, UA: 5.5 (ref 5.0–7.5)

## 2018-05-15 ENCOUNTER — Telehealth: Payer: Self-pay

## 2018-05-15 LAB — CULTURE, URINE COMPREHENSIVE

## 2018-05-15 NOTE — Progress Notes (Signed)
Patient returned back the call and received her lab results.  Patient stated she still feel burning when she urinates and finished her treatment.

## 2018-05-15 NOTE — Telephone Encounter (Signed)
-----   Message from Gildardo Pounds, NP sent at 05/13/2018 11:50 PM EDT ----- Urinalysis is negative for UTI

## 2018-05-15 NOTE — Telephone Encounter (Signed)
CMA attempt to call patient to inform on lab results.  No answer and left a VM for patient to call back.   If patient call back, please inform:  Urinalysis is negative for UTI

## 2018-05-16 ENCOUNTER — Telehealth: Payer: Self-pay

## 2018-05-16 NOTE — Telephone Encounter (Signed)
CMA spoke to patient to inform on lab results.  Patient understood.  Patient stated she still have burning and itchiness on her vaginal, and discomfort when she urinates.  CMA did advised patient to go to Urgent Care or emergency room over the weekend if it get worst.

## 2018-05-16 NOTE — Telephone Encounter (Signed)
-----   Message from Gildardo Pounds, NP sent at 05/16/2018 12:37 PM EDT ----- Urine culture is negative for any bacteria.

## 2018-05-17 ENCOUNTER — Encounter: Payer: Self-pay | Admitting: Nurse Practitioner

## 2018-05-19 NOTE — Telephone Encounter (Signed)
CMA attempt to call patient to inform to make a lab appt. For urine cyctology.  No answer and left a VM for patient to call back.

## 2018-05-19 NOTE — Telephone Encounter (Signed)
Please have patient make lab appointment for urine cytology

## 2018-05-22 ENCOUNTER — Other Ambulatory Visit: Payer: Self-pay | Admitting: Nurse Practitioner

## 2018-05-22 MED ORDER — PHENAZOPYRIDINE HCL 200 MG PO TABS: 200.0000 mg | ORAL_TABLET | Freq: Three times a day (TID) | ORAL | 0 refills | Status: DC | PRN

## 2018-05-26 ENCOUNTER — Telehealth: Payer: Self-pay

## 2018-05-26 NOTE — Telephone Encounter (Signed)
-----   Message from Gildardo Pounds, NP sent at 05/22/2018 11:39 PM EDT ----- Will send in a prescription for her symptoms.

## 2018-05-26 NOTE — Telephone Encounter (Signed)
CMA attempt to call patient to inform her PCP sent a Rx to [facility]armacy for her Urinary symptoms.    No answer and left a VM for patient to call back.  If patient call back, please inform: PCP sent a Rx to [facility] for her Urinary symptoms.   A letter will be send out to reach patient.

## 2018-06-17 ENCOUNTER — Ambulatory Visit (HOSPITAL_COMMUNITY)
Admission: RE | Admit: 2018-06-17 | Discharge: 2018-06-17 | Disposition: A | Discharge status: Home / Self Care | Payer: Self-pay | Source: Ambulatory Visit | Attending: Obstetrics and Gynecology | Admitting: Obstetrics and Gynecology

## 2018-06-17 ENCOUNTER — Other Ambulatory Visit (HOSPITAL_COMMUNITY): Payer: Self-pay | Admitting: Obstetrics and Gynecology

## 2018-06-17 ENCOUNTER — Ambulatory Visit
Admission: RE | Admit: 2018-06-17 | Discharge: 2018-06-17 | Disposition: A | Discharge status: Home / Self Care | Payer: PRIVATE HEALTH INSURANCE | Source: Ambulatory Visit | Attending: Obstetrics and Gynecology | Admitting: Obstetrics and Gynecology

## 2018-06-17 ENCOUNTER — Encounter (HOSPITAL_COMMUNITY): Payer: Self-pay

## 2018-06-17 ENCOUNTER — Other Ambulatory Visit (HOSPITAL_COMMUNITY): Payer: Self-pay | Admitting: *Deleted

## 2018-06-17 VITALS — BP 124/86

## 2018-06-17 DIAGNOSIS — N644 Mastodynia: Secondary | ICD-10-CM

## 2018-06-17 DIAGNOSIS — N6320 Unspecified lump in the left breast, unspecified quadrant: Secondary | ICD-10-CM

## 2018-06-17 DIAGNOSIS — Z1239 Encounter for other screening for malignant neoplasm of breast: Secondary | ICD-10-CM

## 2018-06-17 NOTE — Patient Instructions (Addendum)
Explained breast self awareness with Michelle Valenzuela. Patient did not need a Pap smear today due to last Pap smear was 2 years ago per patient. Let her know [facility] will cover Pap smears every 3 years unless has a history of abnormal Pap smears. Referred patient to the [facility] for a diagnostic mammogram. Appointment scheduled for Tuesday, June 17, 2018 at 1050. Patient aware of appointment and will be there. Michelle Valenzuela verbalized understanding.  Tamas Suen, Arvil [place], RNL 8:57 AM

## 2018-06-17 NOTE — Progress Notes (Signed)
Complaints of bilateral outer breast pain x 2 years that comes and goes. Patient rates the pain at a 8 out of 10.  Pap Smear: Pap smear not completed today. Last Pap smear was 2 years ago in [place] and normal per patient. Per patient has no history of an abnormal Pap smear. No Pap smear results are in Epic.  Physical exam: Breasts Breasts symmetrical. No skin abnormalities bilateral breasts. No nipple retraction bilateral breasts. No nipple discharge bilateral breasts. No lymphadenopathy. No lumps palpated bilateral breasts. Complaints of bilateral outer lower quadrant breast tenderness on exam. Referred patient to the [facility] for a diagnostic mammogram. Appointment scheduled for Tuesday, June 17, 2018 at 1050.        Pelvic/Bimanual No Pap smear completed today since last Pap smear was 2 years ago per patient. Pap smear not indicated per [facility] guidelines.   Smoking History: Patient has never smoked.  Patient Navigation: Patient education provided. Access to services provided for patient through [facility] program. Spanish interpreter provided.   Colorectal Cancer Screening: Per patient has never had a colonoscopy completed. No complaints today. FIT Test given to patient to complete and return to [facility].  Breast and Cervical Cancer Risk Assessment: Patient has no family history of breast cancer, known genetic mutations, or radiation treatment to the chest before age 20. Patient has no history of cervical dysplasia, immunocompromised, or DES exposure in-utero.  Risk Assessment    Risk Scores      06/17/2018   Last edited by: Armond Hang, LPN   5-year risk: 0.8 %   Lifetime risk: 2.8 %         Used Spanish interpreter [name] from [place].

## 2018-06-18 ENCOUNTER — Encounter (HOSPITAL_COMMUNITY): Payer: Self-pay | Admitting: *Deleted

## 2018-06-20 ENCOUNTER — Other Ambulatory Visit: Payer: Self-pay | Admitting: Obstetrics and Gynecology

## 2018-06-20 ENCOUNTER — Ambulatory Visit
Admission: RE | Admit: 2018-06-20 | Discharge: 2018-06-20 | Disposition: A | Discharge status: Home / Self Care | Payer: PRIVATE HEALTH INSURANCE | Source: Ambulatory Visit | Attending: Obstetrics and Gynecology | Admitting: Obstetrics and Gynecology

## 2018-06-20 DIAGNOSIS — N6320 Unspecified lump in the left breast, unspecified quadrant: Secondary | ICD-10-CM

## 2018-06-27 LAB — FECAL OCCULT BLOOD, IMMUNOCHEMICAL: FECAL OCCULT BLD: NEGATIVE

## 2018-07-28 ENCOUNTER — Encounter (HOSPITAL_COMMUNITY): Payer: Self-pay | Admitting: *Deleted

## 2018-07-28 NOTE — Progress Notes (Signed)
Letter mailed to patient with negative Fit Test results.  

## 2018-08-12 ENCOUNTER — Ambulatory Visit: Payer: Self-pay | Admitting: Nurse Practitioner

## 2019-01-05 ENCOUNTER — Ambulatory Visit: Payer: BLUE CROSS/BLUE SHIELD

## 2019-01-05 ENCOUNTER — Ambulatory Visit: Payer: BLUE CROSS/BLUE SHIELD | Attending: Nurse Practitioner | Admitting: Nurse Practitioner

## 2019-01-05 ENCOUNTER — Encounter: Payer: Self-pay | Admitting: Nurse Practitioner

## 2019-01-05 ENCOUNTER — Other Ambulatory Visit: Payer: Self-pay

## 2019-01-05 DIAGNOSIS — E0590 Thyrotoxicosis, unspecified without thyrotoxic crisis or storm: Secondary | ICD-10-CM

## 2019-01-05 NOTE — Progress Notes (Signed)
Assessment & Plan:  Michelle Valenzuela was seen today for medication refill.  Diagnoses and all orders for this visit:  Hyperthyroidism -     T3 -     T4, free -     TSH    Patient has been counseled on age-appropriate routine health concerns for screening and prevention. These are reviewed and up-to-date. Referrals have been placed accordingly. Immunizations are up-to-date or declined.    Subjective:   Chief Complaint  Patient presents with  . Medication Refill    Pt. is asking for TSH medication. Pt stated she's been out for one week.    HPI Michelle Valenzuela 68 y.o. female presents for telehealth visit for medication refill. She has been out of her tapazole for over a week. Telephonic interpreter was used to communicate directly with patient for the entire encounter including providing detailed patient instructions.   Hyperthyroidism She was a no show for her follow up appointment in November 5 months ago. She states she has a history of hyperthyroidism which was being managed by a provider in [place]. She had a thyroid cyst several years ago which she states was not removed. She states she was placed on tapazole but does recall the reason she was placed on this medicine. She will need to see an endocrinologist as well and patient has been advised to apply for financial assistance and schedule to see our financial counselor. The patient denies URI symptoms, tender neck / sore throat, pheochromocytoma. Prior studies include unknown. She denies tachycardia, heat intolerance,  poor sleeping, weight loss, palpitations, diarrhea, increased stool frequency, eye pain, neck pain, photophobia, hair loss.  She endorses irritability, jitteriness, restlessness,  nervousness and shaking.  She has been out of her medication for over a week. Will check TSH today and dose tapazole based on lab results.    Review of Systems  Constitutional: Negative for malaise/fatigue and weight loss.  HENT: Negative.   Negative for nosebleeds.   Eyes: Negative.  Negative for blurred vision, double vision and photophobia.  Respiratory: Negative.  Negative for shortness of breath.   Cardiovascular: Negative.  Negative for palpitations and leg swelling.  Gastrointestinal: Negative.  Negative for heartburn.  Musculoskeletal: Negative.   Neurological: Positive for tremors. Negative for dizziness, focal weakness and seizures.  Psychiatric/Behavioral: Negative for suicidal ideas. The patient is nervous/anxious.     Past Medical History:  Diagnosis Date  . Thyroid disease     Past Surgical History:  Procedure Laterality Date  . HEMORRHOID SURGERY    . OVARIAN CYST SURGERY      Family History  Problem Relation Age of Onset  . Diabetes Neg Hx   . Hypertension Neg Hx     Social History Reviewed with no changes to be made today.   Outpatient Medications Prior to Visit  Medication Sig Dispense Refill  . aspirin EC 81 MG tablet Take 81 mg by mouth daily.    Marland Kitchen bismuth subsalicylate (PEPTO BISMOL) 262 MG/15ML suspension Take 30 mLs by mouth every 6 (six) hours as needed for indigestion.    . methimazole (TAPAZOLE) 10 MG tablet Take 1 tablet (10 mg total) by mouth 3 (three) times daily. 90 tablet 3  . fluticasone (FLONASE) 50 MCG/ACT nasal spray Place 2 sprays into both nostrils daily. (Patient not taking: Reported on 01/05/2019) 16 g 2  . meclizine (ANTIVERT) 25 MG tablet Take 1 tablet (25 mg total) by mouth 3 (three) times daily as needed for dizziness. (Patient not taking: Reported on  01/05/2019) 60 tablet 0  . nystatin (MYCOSTATIN/NYSTOP) powder Apply topically 3 (three) times daily. (Patient not taking: Reported on 01/05/2019) 15 g 0  . phenazopyridine (PYRIDIUM) 200 MG tablet Take 1 tablet (200 mg total) by mouth 3 (three) times daily as needed for pain. (Patient not taking: Reported on 06/17/2018) 10 tablet 0   No facility-administered medications prior to visit.     No Known Allergies     Objective:     There were no vitals taken for this visit. Wt Readings from Last 3 Encounters:  05/12/18 169 lb 12.8 oz (77 kg)  05/02/18 165 lb 6.4 oz (75 kg)         Patient has been counseled extensively about nutrition and exercise as well as the importance of adherence with medications and regular follow-up. The patient was given clear instructions to go to ER or return to medical center if symptoms don't improve, worsen or new problems develop. The patient verbalized understanding.   Follow-up: Return in about 3 months (around 04/06/2019).   Gildardo Pounds, FNP-BC [facility] and [facility], [place]   01/05/2019, 12:57 PM

## 2019-01-06 ENCOUNTER — Other Ambulatory Visit: Payer: Self-pay | Admitting: Nurse Practitioner

## 2019-01-06 DIAGNOSIS — E0590 Thyrotoxicosis, unspecified without thyrotoxic crisis or storm: Secondary | ICD-10-CM

## 2019-01-06 LAB — TSH: TSH: 1.08 u[IU]/mL (ref 0.450–4.500)

## 2019-01-06 LAB — T4, FREE: Free T4: 1.36 ng/dL (ref 0.82–1.77)

## 2019-01-06 LAB — T3: T3, Total: 109 ng/dL (ref 71–180)

## 2019-01-06 MED ORDER — METHIMAZOLE 10 MG PO TABS: 10.0000 mg | ORAL_TABLET | Freq: Three times a day (TID) | ORAL | 1 refills | Status: DC

## 2019-01-07 MED FILL — methIMAzole 10 MG TABS: 10 | 30 days supply | Qty: 90 | Fill #0

## 2019-01-08 ENCOUNTER — Telehealth: Payer: Self-pay

## 2019-01-08 NOTE — Telephone Encounter (Signed)
-----   Message from Gildardo Pounds, NP sent at 01/06/2019  9:20 PM EDT ----- Thyroid level is normal. Thyroid medication has been sent to pharmacy and I have referred you to a thyroid specialist to evaluate the cause of your hyperthyroidism

## 2019-01-08 NOTE — Telephone Encounter (Signed)
CMA attempt to reach patient to inform on results,rx, and referral.  No answer and left a VM.

## 2019-01-28 ENCOUNTER — Ambulatory Visit (INDEPENDENT_AMBULATORY_CARE_PROVIDER_SITE_OTHER): Payer: Self-pay | Admitting: Internal Medicine

## 2019-01-28 ENCOUNTER — Other Ambulatory Visit: Payer: Self-pay

## 2019-01-28 ENCOUNTER — Encounter: Payer: Self-pay | Admitting: Internal Medicine

## 2019-01-28 VITALS — BP 128/82 | HR 85 | Temp 97.9°F | Ht 60.0 in | Wt 164.2 lb

## 2019-01-28 DIAGNOSIS — E0590 Thyrotoxicosis, unspecified without thyrotoxic crisis or storm: Secondary | ICD-10-CM

## 2019-01-28 DIAGNOSIS — E041 Nontoxic single thyroid nodule: Secondary | ICD-10-CM | POA: Diagnosis not present

## 2019-01-28 MED ORDER — METHIMAZOLE 10 MG PO TABS: 10.0000 mg | ORAL_TABLET | Freq: Two times a day (BID) | ORAL | 1 refills | Status: DC

## 2019-01-28 NOTE — Progress Notes (Signed)
Name: Michelle Valenzuela  MRN/ DOB: [id number], 02-21-1951    Age/ Sex: 68 y.o., female    PCP: Gildardo Pounds, NP   Reason for Endocrinology Evaluation: Hyperthyroidism     Date of Initial Endocrinology Evaluation: 01/28/2019     HPI: [name] is a 68 y.o. female with a past medical history of Hyperthyroidism. The patient presented for initial endocrinology clinic visit on 01/28/2019 for consultative assistance with her hyperthyroidism.    An interpreter was present through Michelle Valenzuela [facility] [place])  She is accompanied by a friend today   She was diagnosed with hyperthyroidism in 2012 . She has been on Methimazole since. She is not aware of the cause of her hyperthyroidism but does recall having a left thyroid nodule with a benign FNA in 2012.  Today she denies any side effects to methimazole.  She denies any hyperthyroid or hypothyroid symptoms such as weight changes, heat/cold intolerance, constipation/diarrhea. She also denies palpitations but endorses anxiety.    She does have eye itching and discomfort  Denies local neck symptoms.   No FH of thyroid disease     HISTORY:  Past Medical History:  Past Medical History:  Diagnosis Date  . Thyroid disease    Past Surgical History:  Past Surgical History:  Procedure Laterality Date  . HEMORRHOID SURGERY    . OVARIAN CYST SURGERY      Social History:  reports that she has never smoked. She has never used smokeless tobacco. She reports previous alcohol use. She reports previous drug use.  Family History: family history is not on file.   HOME MEDICATIONS: Allergies as of 01/28/2019   No Known Allergies     Medication List       Accurate as of January 28, 2019  4:02 PM. Always use your most recent med list.        aspirin EC 81 MG tablet Take 81 mg by mouth daily.   bismuth subsalicylate 517 OH/60VP suspension Commonly known as:  PEPTO BISMOL Take 30 mLs by mouth every 6 (six) hours as needed for  indigestion.   methimazole 10 MG tablet Commonly known as:  TAPAZOLE Take 1 tablet (10 mg total) by mouth 2 (two) times a day for 30 days.         REVIEW OF SYSTEMS: A comprehensive ROS was conducted with the patient and is negative except as per HPI and below:  Review of Systems  Constitutional: Positive for malaise/fatigue. Negative for weight loss.  HENT: Negative for congestion and sore throat.   Eyes: Negative for blurred vision.  Respiratory: Negative for cough and shortness of breath.   Cardiovascular: Negative for chest pain and palpitations.  Gastrointestinal: Negative for diarrhea and nausea.  Genitourinary: Negative for frequency.  Neurological: Negative for tingling and tremors.  Endo/Heme/Allergies: Negative for polydipsia.  Psychiatric/Behavioral: Negative for depression. The patient is nervous/anxious.        OBJECTIVE:  VS: BP 128/82 (BP Location: Left Arm, Patient Position: Sitting, Cuff Size: Normal)   Pulse 85   Temp 97.9 F (36.6 C)   Ht 5' (1.524 m)   Wt 164 lb 3.2 oz (74.5 kg)   SpO2 98%   BMI 32.07 kg/m    Wt Readings from Last 3 Encounters:  01/28/19 164 lb 3.2 oz (74.5 kg)  05/12/18 169 lb 12.8 oz (77 kg)  05/02/18 165 lb 6.4 oz (75 kg)     EXAM: General: Pt appears well and is in NAD  Hydration: Well-hydrated  with moist mucous membranes and good skin turgor  Eyes: External eye exam normal without stare, lid lag or exophthalmos.  EOM intact.    Ears, Nose, Throat: Hearing: Grossly intact bilaterally Dental: Good dentition  Throat: Clear without mass, erythema or exudate  Neck: General: Supple without adenopathy. Thyroid: Left thyroid nodule noted.No thyroid bruit.  Lungs: Clear with good BS bilat with no rales, rhonchi, or wheezes  Heart: Auscultation: RRR.  Abdomen: Normoactive bowel sounds, soft, nontender, without masses or organomegaly palpable  Extremities:  BL LE: No pretibial edema normal ROM and strength.  Skin: Hair: Texture  and amount normal with gender appropriate distribution Skin Inspection: No rashes. Skin Palpation: Skin temperature, texture, and thickness normal to palpation  Neuro: Cranial nerves: II - XII grossly intact  Motor: Normal strength throughout DTRs: 2+ and symmetric in UE without delay in relaxation phase  Mental Status: Judgment, insight: Intact Orientation: Oriented to time, place, and person Mood and affect: No depression, anxiety, or agitation     DATA REVIEWED: Results for KAREE, FORGE (MRN [id number]) as of 01/28/2019 15:42  Ref. Range 01/05/2019 12:11  TSH Latest Ref Range: 0.450 - 4.500 uIU/mL 1.080  Triiodothyronine (T3) Latest Ref Range: 71 - 180 ng/dL 109  T4,Free(Direct) Latest Ref Range: 0.82 - 1.77 ng/dL 1.36      ASSESSMENT/PLAN/RECOMMENDATIONS:   1. Hyperthyroidism:  - Clinically and biochemicaly euthyroid on current dose of methimazole -  No local neck symptoms. - We discussed D/D of hyperthyroidism to include Graves' disease vs toxic nodule (s) - We will need to proceed with thyroid uptake and scan to determine the cause.  - Will discuss all treatment options once cause is determined such as continued thionamide therapy vs RAI ablation  - We discussed with pt the benefits of methimazole in the Tx of hyperthyroidism, as well as the possible side effects/complications of anti-thyroid drug Tx (specifically detailing the rare, but serious side effect of agranulocytosis). She was informed of need for regular thyroid function monitoring while on methimazole to ensure appropriate dosage without over-treatment. As well, we discussed the possible side effects of methimazole including the chance of rash, the small chance of liver irritation/juandice and the <=1 in 300-400 chance of sudden onset agranulocytosis.  We discussed importance of seeking medical attention promptly (and stopping methimazole) if she were to develop significant fever with severe sore throat of other  evidence of acute infection.   - She understands this will not be scheduled anytime soon due to the pandemic.   Medications : Methimazole 10 mg BID  2. Left thyroid nodule:  - No local neck symptoms  - Will proceed with uptake and scan once radiology is accepting non-urgent  referrals again.  - Per pt an FNA was performed in 2012 with benign cytology.   F/u in 3 months    Signed electronically by: Mack Guise, MD  [facility] End[facility]inology  [facility] [facility] [address] [place], [place] [address] Phone: [phone] FAX: [phone]   CC: Gildardo Pounds, NP [place] [address] Phone: [phone] Fax: [phone]   Return to Endocrinology clinic as below: Future Appointments  Date Time Provider [place]  04/22/2019  2:00 PM Kamirah Shugrue, Melanie Crazier, MD [facility] None

## 2019-01-31 LAB — TRAB (TSH RECEPTOR BINDING ANTIBODY): TRAB: <1 IU/L (ref <=2.00)

## 2019-01-31 LAB — TSH: TSH: 1.21 mIU/L (ref 0.40–4.50)

## 2019-01-31 LAB — T4, FREE: Free T4: 1.1 ng/dL (ref 0.8–1.8)

## 2019-02-02 ENCOUNTER — Encounter: Payer: Self-pay | Admitting: Internal Medicine

## 2019-04-22 ENCOUNTER — Encounter: Payer: Self-pay | Admitting: Internal Medicine

## 2019-04-22 ENCOUNTER — Ambulatory Visit (INDEPENDENT_AMBULATORY_CARE_PROVIDER_SITE_OTHER): Payer: BLUE CROSS/BLUE SHIELD | Admitting: Internal Medicine

## 2019-04-22 ENCOUNTER — Other Ambulatory Visit: Payer: Self-pay

## 2019-04-22 VITALS — BP 118/82 | HR 101 | Temp 99.2°F | Ht 60.0 in | Wt 163.8 lb

## 2019-04-22 DIAGNOSIS — E0590 Thyrotoxicosis, unspecified without thyrotoxic crisis or storm: Secondary | ICD-10-CM

## 2019-04-22 DIAGNOSIS — E041 Nontoxic single thyroid nodule: Secondary | ICD-10-CM

## 2019-04-22 LAB — T4, FREE: Free T4: 0.89 ng/dL (ref 0.60–1.60)

## 2019-04-22 LAB — TSH: TSH: 0.73 u[IU]/mL (ref 0.35–4.50)

## 2019-04-22 MED ORDER — METHIMAZOLE 10 MG PO TABS: 10.0000 mg | ORAL_TABLET | Freq: Two times a day (BID) | ORAL | 3 refills | Status: DC

## 2019-04-22 MED FILL — methIMAzole 10 MG TABS: 10 | 30 days supply | Qty: 90 | Fill #1

## 2019-04-22 NOTE — Progress Notes (Signed)
Name: Michelle Valenzuela  MRN/ DOB: [id number], Apr 03, 1951    Age/ Sex: 68 y.o., female     PCP: Gildardo Pounds, NP   Reason for Endocrinology Evaluation: Hyperthyroidism      Initial Endocrinology Clinic Visit: 01/28/2019    PATIENT IDENTIFIER: Michelle Valenzuela is a 68 y.o., female with a past medical history of hyperthyroidism. She has followed with [facility] since 01/28/2019 for consultative assistance with management of her hyperthyroidism.   HISTORICAL SUMMARY: The patient was first diagnosed with hyperthyroidism in 2012 . She has been on Methimazole since. She is not aware of the cause of her hyperthyroidism but does recall having a left thyroid nodule with a benign FNA in 2012.  SUBJECTIVE:   During last visit (01/28/2019): We continued methimazole 10 mg BID  Today (04/22/2019):  Michelle Valenzuela is here for a 3 month follow up on Hyperthyroidism . Today she denies any weight changes, diarrhea, or tremors. She has been compliant with methimazole intake.   She has occasional palpitations Has mild anxiety Denies any local neck symptoms  Not on biotin   ROS:  As per HPI.   HISTORY:  Past Medical History:  Past Medical History:  Diagnosis Date  . Thyroid disease    Past Surgical History:  Past Surgical History:  Procedure Laterality Date  . HEMORRHOID SURGERY    . OVARIAN CYST SURGERY      Social History:  reports that she has never smoked. She has never used smokeless tobacco. She reports previous alcohol use. She reports previous drug use. Family History:  Family History  Problem Relation Age of Onset  . Diabetes Neg Hx   . Hypertension Neg Hx      HOME MEDICATIONS: Allergies as of 04/22/2019   No Known Allergies     Medication List       Accurate as of April 22, 2019  2:26 PM. If you have any questions, ask your nurse or doctor.        aspirin EC 81 MG tablet Take 81 mg by mouth daily.   bismuth subsalicylate 680 HO/12YQ  suspension Commonly known as: PEPTO BISMOL Take 30 mLs by mouth every 6 (six) hours as needed for indigestion.   methimazole 10 MG tablet Commonly known as: TAPAZOLE Take 1 tablet (10 mg total) by mouth 2 (two) times a day.         OBJECTIVE:   PHYSICAL EXAM: VS: BP 118/82 (BP Location: Left Arm, Patient Position: Sitting, Cuff Size: Normal)   Pulse (!) 101   Temp 99.2 F (37.3 C)   Ht 5' (1.524 m)   Wt 163 lb 12.8 oz (74.3 kg)   SpO2 98%   BMI 31.99 kg/m    EXAM: General: Pt appears well and is in NAD  Ears, Nose, Throat: Hearing: Grossly intact bilaterally Dental: Good dentition  Throat: Clear without mass, erythema or exudate  Neck: General: Supple without adenopathy. Thyroid: Thyroid size normal.  No goiter or nodules appreciated. No thyroid bruit.  Lungs: Clear with good BS bilat with no rales, rhonchi, or wheezes  Heart: Auscultation: RRR.  Extremities:  BL LE: No pretibial edema normal ROM and strength.  Skin: Hair: Texture and amount normal with gender appropriate distribution Skin Inspection: No rashes Skin Palpation: Skin temperature, texture, and thickness normal to palpation  Mental Status: Judgment, insight: Intact Orientation: Oriented to time, place, and person Mood and affect: No depression, anxiety, or agitation     DATA REVIEWED:  Results  for Michelle Valenzuela, Michelle Valenzuela (MRN [id number]) as of 04/23/2019 09:56  Ref. Range 05/02/2018 15:59 01/05/2019 12:11 01/28/2019 15:04 04/22/2019 14:48  TSH Latest Ref Range: 0.35 - 4.50 uIU/mL 0.933 1.080 1.21 0.73  Triiodothyronine (T3) Latest Ref Range: 71 - 180 ng/dL 137 109    T4,Free(Direct) Latest Ref Range: 0.60 - 1.60 ng/dL 1.55 1.36 1.1 0.89     ASSESSMENT / PLAN / RECOMMENDATIONS:   1. Hyperthyroidism:  - Could be toxic nodule, TRAb levels negative - We discussed treatment options of proceeding with Thyroid uptake and scan the possibility of treatment with RAI ablation.  - Pt is not keep on this at this time,  she is scared of iodine, apparently she has been offered this in the past and declined as well.  - Will continue with methimazole at current dose.   Medications   Methimazole 10 mg, 2 tablets daily    2. Left thyroid nodule :   - No local neck symptoms - This is based on pt history, since she has declined thyroid uptake and scan, will proceed with an ultrasound   F/u in 6 months   Signed electronically by: Mack Guise, MD  [facility] End[facility]in[facility]  [facility] [address] [place], [place] [address] Phone: [phone] FAX: [phone]      CC: Gildardo Pounds, NP [place] [address] Phone: [phone]  Fax: [phone]   Return to Endocrinology clinic as below: Future Appointments  Date Time Provider [place]  10/26/2019  2:00 PM Laurance Heide, Melanie Crazier, MD [facility] None

## 2019-04-23 ENCOUNTER — Encounter: Payer: Self-pay | Admitting: Internal Medicine

## 2019-10-26 ENCOUNTER — Ambulatory Visit: Payer: Self-pay | Admitting: Internal Medicine

## 2019-10-26 NOTE — Progress Notes (Deleted)
Name: Michelle Valenzuela  MRN/ DOB: [id number], March 03, 1951    Age/ Sex: 69 y.o., female     PCP: Gildardo Pounds, NP   Reason for Endocrinology Evaluation: Hyperthyroidism      Initial Endocrinology Clinic Visit: 01/28/2019    PATIENT IDENTIFIER: Michelle Valenzuela is a 69 y.o., female with a past medical history of hyperthyroidism. She has followed with [facility] since 01/28/2019 for consultative assistance with management of her hyperthyroidism.   HISTORICAL SUMMARY: The patient was first diagnosed with hyperthyroidism in 2012 . She has been on Methimazole since. She is not aware of the cause of her hyperthyroidism but does recall having a left thyroid nodule with a benign FNA in 2012.  SUBJECTIVE:   During last visit (04/22/2019): We continued methimazole 10 mg BID  Today (10/26/2019):  Ms. Menze is here for a follow up on Hyperthyroidism . She has not been here since 04/2019. Today she denies any weight changes, diarrhea, or tremors. She has been compliant with methimazole intake.   She has occasional palpitations Has mild anxiety Denies any local neck symptoms  Not on biotin   ROS:  As per HPI.   HISTORY:  Past Medical History:  Past Medical History:  Diagnosis Date  . Thyroid disease    Past Surgical History:  Past Surgical History:  Procedure Laterality Date  . HEMORRHOID SURGERY    . OVARIAN CYST SURGERY      Social History:  reports that she has never smoked. She has never used smokeless tobacco. She reports previous alcohol use. She reports previous drug use. Family History:  Family History  Problem Relation Age of Onset  . Diabetes Neg Hx   . Hypertension Neg Hx      HOME MEDICATIONS: Allergies as of 10/26/2019   No Known Allergies     Medication List       Accurate as of October 26, 2019 12:43 PM. If you have any questions, ask your nurse or doctor.        aspirin EC 81 MG tablet Take 81 mg by mouth daily.   bismuth  subsalicylate [id number] suspension Commonly known as: PEPTO BISMOL Take 30 mLs by mouth every 6 (six) hours as needed for indigestion.   methimazole 10 MG tablet Commonly known as: TAPAZOLE Take 1 tablet (10 mg total) by mouth 2 (two) times a day.         OBJECTIVE:   PHYSICAL EXAM: VS: There were no vitals taken for this visit.   EXAM: General: Pt appears well and is in NAD  Ears, Nose, Throat: Hearing: Grossly intact bilaterally Dental: Good dentition  Throat: Clear without mass, erythema or exudate  Neck: General: Supple without adenopathy. Thyroid: Thyroid size normal.  No goiter or nodules appreciated. No thyroid bruit.  Lungs: Clear with good BS bilat with no rales, rhonchi, or wheezes  Heart: Auscultation: RRR.  Extremities:  BL LE: No pretibial edema normal ROM and strength.  Skin: Hair: Texture and amount normal with gender appropriate distribution Skin Inspection: No rashes Skin Palpation: Skin temperature, texture, and thickness normal to palpation  Mental Status: Judgment, insight: Intact Orientation: Oriented to time, place, and person Mood and affect: No depression, anxiety, or agitation     DATA REVIEWED:  Results for GEORGANNE, SLAVEY (MRN [id number]) as of 04/23/2019 09:56  Ref. Range 05/02/2018 15:59 01/05/2019 12:11 01/28/2019 15:04 04/22/2019 14:48  TSH Latest Ref Range: 0.35 - 4.50 uIU/mL 0.933 1.080 1.21 0.73  Triiodothyronine (T3) Latest  Ref Range: 71 - 180 ng/dL 137 109    T4,Free(Direct) Latest Ref Range: 0.60 - 1.60 ng/dL 1.55 1.36 1.1 0.89     ASSESSMENT / PLAN / RECOMMENDATIONS:   1. Hyperthyroidism:  - Could be toxic nodule, TRAb levels negative - We discussed treatment options of proceeding with Thyroid uptake and scan the possibility of treatment with RAI ablation.  - Pt is not keep on this at this time, she is scared of iodine, apparently she has been offered this in the past and declined as well.  - Will continue with methimazole at  current dose.   Medications   Methimazole 10 mg, 2 tablets daily    2. Left thyroid nodule :   - No local neck symptoms - This is based on pt history, since she has declined thyroid uptake and scan, will proceed with an ultrasound   F/u in 6 months   Signed electronically by: Mack Guise, MD  [facility] Endocrinology  [facility] [address] [place], [place] [address] Phone: [phone] FAX: [phone]      CC: Gildardo Pounds, NP [place] [address] Phone: [phone]  Fax: [phone]   Return to Endocrinology clinic as below: Future Appointments  Date Time Provider [place]  10/26/2019  2:00 PM Vearl Allbaugh, Melanie Crazier, MD [facility] None

## 2019-12-25 ENCOUNTER — Ambulatory Visit: Payer: Self-pay | Admitting: Nurse Practitioner

## 2019-12-30 ENCOUNTER — Ambulatory Visit: Payer: Self-pay | Admitting: Nurse Practitioner

## 2020-01-11 ENCOUNTER — Other Ambulatory Visit: Payer: Self-pay

## 2020-01-11 ENCOUNTER — Ambulatory Visit (INDEPENDENT_AMBULATORY_CARE_PROVIDER_SITE_OTHER): Payer: 59 | Admitting: Nurse Practitioner

## 2020-01-11 ENCOUNTER — Other Ambulatory Visit (HOSPITAL_COMMUNITY)
Admission: RE | Admit: 2020-01-11 | Discharge: 2020-01-11 | Disposition: A | Discharge status: Home / Self Care | Payer: 59 | Source: Ambulatory Visit | Attending: Nurse Practitioner | Admitting: Nurse Practitioner

## 2020-01-11 ENCOUNTER — Encounter: Payer: Self-pay | Admitting: Nurse Practitioner

## 2020-01-11 ENCOUNTER — Encounter: Payer: Self-pay | Admitting: Gastroenterology

## 2020-01-11 VITALS — BP 122/82 | HR 86 | Temp 97.1°F | Ht 60.63 in | Wt 163.6 lb

## 2020-01-11 DIAGNOSIS — Z1211 Encounter for screening for malignant neoplasm of colon: Secondary | ICD-10-CM

## 2020-01-11 DIAGNOSIS — E781 Pure hyperglyceridemia: Secondary | ICD-10-CM | POA: Diagnosis not present

## 2020-01-11 DIAGNOSIS — Z124 Encounter for screening for malignant neoplasm of cervix: Secondary | ICD-10-CM | POA: Diagnosis present

## 2020-01-11 DIAGNOSIS — Z0001 Encounter for general adult medical examination with abnormal findings: Secondary | ICD-10-CM | POA: Insufficient documentation

## 2020-01-11 DIAGNOSIS — E0590 Thyrotoxicosis, unspecified without thyrotoxic crisis or storm: Secondary | ICD-10-CM | POA: Diagnosis not present

## 2020-01-11 DIAGNOSIS — Z1231 Encounter for screening mammogram for malignant neoplasm of breast: Secondary | ICD-10-CM | POA: Diagnosis not present

## 2020-01-11 DIAGNOSIS — Z780 Asymptomatic menopausal state: Secondary | ICD-10-CM | POA: Diagnosis not present

## 2020-01-11 DIAGNOSIS — R87811 Vaginal high risk human papillomavirus (HPV) DNA test positive: Secondary | ICD-10-CM

## 2020-01-11 LAB — COMPREHENSIVE METABOLIC PANEL
ALT: 16 U/L (ref 0–35)
AST: 16 U/L (ref 0–37)
Albumin: 4.1 g/dL (ref 3.5–5.2)
Alkaline Phosphatase: 99 U/L (ref 39–117)
BUN: 18 mg/dL (ref 6–23)
CO2: 26 mEq/L (ref 19–32)
Calcium: 9.5 mg/dL (ref 8.4–10.5)
Chloride: 106 mEq/L (ref 96–112)
Creatinine, Ser: 0.72 mg/dL (ref 0.40–1.20)
GFR: 80.17 mL/min (ref >60.00)
Glucose, Bld: 100 mg/dL — ABNORMAL HIGH (ref 70–99)
Potassium: 4.3 mEq/L (ref 3.5–5.1)
Sodium: 140 mEq/L (ref 135–145)
Total Bilirubin: 0.7 mg/dL (ref 0.2–1.2)
Total Protein: 6.7 g/dL (ref 6.0–8.3)

## 2020-01-11 LAB — T4, FREE: Free T4: 0.75 ng/dL (ref 0.60–1.60)

## 2020-01-11 LAB — CBC
HCT: 41.8 % (ref 36.0–46.0)
Hemoglobin: 14 g/dL (ref 12.0–15.0)
MCHC: 33.4 g/dL (ref 30.0–36.0)
MCV: 91.1 fl (ref 78.0–100.0)
Platelets: 241 10*3/uL (ref 150.0–400.0)
RBC: 4.59 Mil/uL (ref 3.87–5.11)
RDW: 13.2 % (ref 11.5–15.5)
WBC: 8 10*3/uL (ref 4.0–10.5)

## 2020-01-11 LAB — LIPID PANEL
Cholesterol: 192 mg/dL (ref 0–200)
HDL: 45.1 mg/dL (ref >39.00)
LDL Cholesterol: 114 mg/dL — ABNORMAL HIGH (ref 0–99)
NonHDL: 146.55
Total CHOL/HDL Ratio: 4
Triglycerides: 164 mg/dL — ABNORMAL HIGH (ref 0.0–149.0)
VLDL: 32.8 mg/dL (ref 0.0–40.0)

## 2020-01-11 LAB — T3, FREE: T3, Free: 3.5 pg/mL (ref 2.3–4.2)

## 2020-01-11 LAB — TSH: TSH: 4.18 u[IU]/mL (ref 0.35–4.50)

## 2020-01-11 MED ORDER — METHIMAZOLE 10 MG PO TABS: 10.0000 mg | ORAL_TABLET | Freq: Two times a day (BID) | ORAL | 0 refills | Status: DC

## 2020-01-11 NOTE — Patient Instructions (Addendum)
Sign medical release to get records from [place].  Schedule appt with endocrinology  You will be contacted to schedule appt with GI, for mammogram and bone density.  Thank you for choosing [facility] for your health needs.  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 Need to repeat PAP in 1year. Stable cbc, thyroid function, and CMP Abnormal lipid panel due to elevated trig and LDL. You have a cardiovascular disease risk of 8.1%. Maintain DASH diet and regular exercise. Will repeat in 1year (fasting) Fax copy of results sent to Dr. Burney Gauze (endocrinology with [facility]).  Preventive Care 70 Years and Older, Female Preventive care refers to lifestyle choices and visits with your health care provider that can promote health and wellness. This includes:  A yearly physical exam. This is also called an annual well check.  Regular dental and eye exams.  Immunizations.  Screening for certain conditions.  Healthy lifestyle choices, such as diet and exercise. What can I expect for my preventive care visit? Physical exam Your health care provider will check:  Height and weight. These may be used to calculate body mass index (BMI), which is a measurement that tells if you are at a healthy weight.  Heart rate and blood pressure.  Your skin for abnormal spots. Counseling Your health care provider may ask you questions about:  Alcohol, tobacco, and drug use.  Emotional well-being.  Home and relationship well-being.  Sexual activity.  Eating habits.  History of falls.  Memory and ability to understand (cognition).  Work and work Statistician.  Pregnancy and menstrual history. What immunizations do I need?  Influenza (flu) vaccine  This is recommended every year. Tetanus, diphtheria, and pertussis (Tdap) vaccine  You may need a Td booster every 10 years. Varicella (chickenpox) vaccine  You may need this vaccine if you have not already been  vaccinated. Zoster (shingles) vaccine  You may need this after age 70. Pneumococcal conjugate (PCV13) vaccine  One dose is recommended after age 70. Pneumococcal polysaccharide (PPSV23) vaccine  One dose is recommended after age 70. Measles, mumps, and rubella (MMR) vaccine  You may need at least one dose of MMR if you were born in 1957 or later. You may also need a second dose. Meningococcal conjugate (MenACWY) vaccine  You may need this if you have certain conditions. Hepatitis A vaccine  You may need this if you have certain conditions or if you travel or work in places where you may be exposed to hepatitis A. Hepatitis B vaccine  You may need this if you have certain conditions or if you travel or work in places where you may be exposed to hepatitis B. Haemophilus influenzae type b (Hib) vaccine  You may need this if you have certain conditions. You may receive vaccines as individual doses or as more than one vaccine together in one shot (combination vaccines). Talk with your health care provider about the risks and benefits of combination vaccines. What tests do I need? Blood tests  Lipid and cholesterol levels. These may be checked every 5 years, or more frequently depending on your overall health.  Hepatitis C test.  Hepatitis B test. Screening  Lung cancer screening. You may have this screening every year starting at age 67 if you have a 30-pack-year history of smoking and currently smoke or have quit within the past 15 years.  Colorectal cancer screening. All adults should have this screening starting at age 70 and continuing until age 70. Your health care provider  may recommend screening at age 93 if you are at increased risk. You will have tests every 1-10 years, depending on your results and the type of screening test.  Diabetes screening. This is done by checking your blood sugar (glucose) after you have not eaten for a while (fasting). You may have this done  every 1-3 years.  Mammogram. This may be done every 1-2 years. Talk with your health care provider about how often you should have regular mammograms.  BRCA-related cancer screening. This may be done if you have a family history of breast, ovarian, tubal, or peritoneal cancers. Other tests  Sexually transmitted disease (STD) testing.  Bone density scan. This is done to screen for osteoporosis. You may have this done starting at age 70. Follow these instructions at home: Eating and drinking  Eat a diet that includes fresh fruits and vegetables, whole grains, lean protein, and low-fat dairy products. Limit your intake of foods with high amounts of sugar, saturated fats, and salt.  Take vitamin and mineral supplements as recommended by your health care provider.  Do not drink alcohol if your health care provider tells you not to drink.  If you drink alcohol: ? Limit how much you have to 0-1 drink a day. ? Be aware of how much alcohol is in your drink. In the [place] one drink equals one 12 oz bottle of beer (355 mL), one 5 oz glass of wine (148 mL), or one 1 oz glass of hard liquor (44 mL). Lifestyle  Take daily care of your teeth and gums.  Stay active. Exercise for at least 30 minutes on 5 or more days each week.  Do not use any products that contain nicotine or tobacco, such as cigarettes, e-cigarettes, and chewing tobacco. If you need help quitting, ask your health care provider.  If you are sexually active, practice safe sex. Use a condom or other form of protection in order to prevent STIs (sexually transmitted infections).  Talk with your health care provider about taking a low-dose aspirin or statin. What's next?  Go to your health care provider once a year for a well check visit.  Ask your health care provider how often you should have your eyes and teeth checked.  Stay up to date on all vaccines. This information is not intended to replace advice given to you by your  health care provider. Make sure you discuss any questions you have with your health care provider. Document Revised: 09/11/2018 Document Reviewed: 09/11/2018 Elsevier Patient Education  2020 [employer].

## 2020-01-11 NOTE — Progress Notes (Signed)
Subjective:    Patient ID: Michelle Valenzuela, female    DOB: 31-May-1951, 69 y.o.   MRN: [id number]  Patient presents today for complete physical and referrral to endo  HPI Moved from [facility]yrs ago.  Hx of hyperthyroidism: Followed by Dr. Burney Gauze with [facility], last OV 2020. She needs referral in order to schedule appt. She has been out of medication for 1week. She plans to schedule appt with ophthalmology.  Sexual History (orientation,birth control, marital status, STD):single, not sexually active, no previous PAP or mammogram  Depression/Suicide: Depression screen [facility] 2/9 01/11/2020 05/12/2018 05/02/2018  Decreased Interest 0 0 1  Down, Depressed, Hopeless 0 0 1  PHQ - 2 Score 0 0 2  Altered sleeping - 0 -  Tired, decreased energy - 1 1  Change in appetite - 0 -  Feeling bad or failure about yourself  - 0 -  Trouble concentrating - 0 -  Moving slowly or fidgety/restless - 0 -  Suicidal thoughts - 0 -  PHQ-9 Score - 1 -   Vision:will schedule  Dental:will schedule  Immunizations: (TDAP, Hep C screen, Pneumovax, Influenza, zoster)  Health Maintenance  Topic Date Due  . Tetanus Vaccine  Never done  . Colon Cancer Screening  Never done  . DEXA scan (bone density measurement)  Never done  . Pneumonia vaccines (1 of 2 - PCV13) Never done  . Flu Shot  05/01/2020  . Mammogram  06/17/2020  .  Hepatitis C: One time screening is recommended by Center for Disease Control  (CDC) for  adults born from [birth year] through 1966.   Completed   Diet:regular.  Weight:  Wt Readings from Last 3 Encounters:  01/11/20 163 lb 9.6 oz (74.2 kg)  04/22/19 163 lb 12.8 oz (74.3 kg)  01/28/19 164 lb 3.2 oz (74.5 kg)   Exercise:none  Fall Risk: Fall Risk  01/11/2020 05/12/2018 05/02/2018  Falls in the past year? 0 No No  Number falls in past yr: 0 - -  Injury with Fall? 0 - -   Medications and allergies reviewed with patient and updated if appropriate.  Patient Active Problem List   Diagnosis Date  Noted  . 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 Asymptomatic age-related postmenopausal state 01/14/2020  . Hypertriglyceridemia 01/14/2020  . Left thyroid nodule 01/28/2019  . Hyperthyroidism 01/28/2019    Current Outpatient Medications on File Prior to Visit  Medication Sig Dispense Refill  . aspirin EC 81 MG tablet Take 81 mg by mouth daily.    Marland Kitchen bismuth subsalicylate (PEPTO BISMOL) 262 MG/15ML suspension Take 30 mLs by mouth every 6 (six) hours as needed for indigestion.     No current facility-administered medications on file prior to visit.    Past Medical History:  Diagnosis Date  . Thyroid disease     Past Surgical History:  Procedure Laterality Date  . HEMORRHOID SURGERY    . OVARIAN CYST SURGERY      Social History   Socioeconomic History  . Marital status: Married    Spouse name: Not on file  . Number of children: Not on file  . Years of education: Not on file  . Highest education level: Not on file  Occupational History  . Not on file  Tobacco Use  . Smoking status: Never Smoker  . Smokeless tobacco: Never Used  Substance and Sexual Activity  . Alcohol use: Not Currently  . Drug use: Not Currently  . Sexual activity: Yes  Other Topics  Concern  . Not on file  Social History Narrative  . Not on file   Social Determinants of Health   Financial Resource Strain:   . Difficulty of Paying Living Expenses:   Food Insecurity:   . Worried About Charity fundraiser in the Last Year:   . Arboriculturist in the Last Year:   Transportation Needs:   . Film/video editor (Medical):   Marland Kitchen Lack of Transportation (Non-Medical):   Physical Activity:   . Days of Exercise per Week:   . Minutes of Exercise per Session:   Stress:   . Feeling of Stress :   Social Connections:   . Frequency of Communication with Friends and Family:   . Frequency of Social Gatherings with Friends and Family:   . Attends Religious Services:   . Active Member of  Clubs or Organizations:   . Attends Archivist Meetings:   Marland Kitchen Marital Status:     Family History  Problem Relation Age of Onset  . Diabetes Neg Hx   . Hypertension Neg Hx         ROS  Objective:   Vitals:   01/11/20 1031  BP: 122/82  Pulse: 86  Temp: (!) 97.1 F (36.2 C)  SpO2: 96%    Body mass index is 31.29 kg/m.   Physical Examination:  Physical Exam Vitals reviewed. Exam conducted with a chaperone present.  Constitutional:      General: She is not in acute distress.    Appearance: She is well-developed.  HENT:     Right Ear: Tympanic membrane, ear canal and external ear normal.     Left Ear: Tympanic membrane, ear canal and external ear normal.  Eyes:     Extraocular Movements: Extraocular movements intact.     Conjunctiva/sclera: Conjunctivae normal.  Cardiovascular:     Rate and Rhythm: Normal rate and regular rhythm.     Pulses: Normal pulses.     Heart sounds: Normal heart sounds.  Pulmonary:     Effort: Pulmonary effort is normal. No respiratory distress.     Breath sounds: Normal breath sounds.  Chest:     Chest wall: No tenderness.     Breasts:        Right: Normal.        Left: Normal.  Abdominal:     General: Bowel sounds are normal.     Palpations: Abdomen is soft.     Hernia: There is no hernia in the left inguinal area or right inguinal area.  Genitourinary:    Labia:        Right: No rash or tenderness.        Left: No rash or tenderness.      Urethra: Prolapse present.     Vagina: Normal.     Cervix: Normal.     Uterus: Normal.      Adnexa: Right adnexa normal and left adnexa normal.  Musculoskeletal:        General: Normal range of motion.     Cervical back: Normal range of motion and neck supple.     Right lower leg: No edema.     Left lower leg: No edema.  Lymphadenopathy:     Cervical: No cervical adenopathy.     Upper Body:     Right upper body: No supraclavicular, axillary or pectoral adenopathy.     Left  upper body: No supraclavicular, axillary or pectoral adenopathy.     Lower Body:  No right inguinal adenopathy. No left inguinal adenopathy.  Neurological:     Mental Status: She is alert and oriented to person, place, and time.     Deep Tendon Reflexes: Reflexes are normal and symmetric.  Psychiatric:        Mood and Affect: Mood normal.        Behavior: Behavior normal.        Thought Content: Thought content normal.     ASSESSMENT and PLAN: This visit occurred during the SARS-CoV-2 public health emergency.  Safety protocols were in place, including screening questions prior to the visit, additional usage of staff PPE, and extensive cleaning of exam room while observing appropriate contact time as indicated for disinfecting solutions.   [name] was seen today for establish care.  Diagnoses and all orders for this visit:  Encounter for preventative adult health care exam with abnormal findings -     CBC -     Comprehensive metabolic panel -     MM DIGITAL SCREENING BILATERAL; Future -     Ambulatory referral to Gastroenterology -     Cytology - PAP( [place])  Hyperthyroidism -     Ambulatory referral to Endocrinology -     TSH -     T4, free -     T3, free -     methimazole (TAPAZOLE) 10 MG tablet; Take 1 tablet (10 mg total) by mouth 2 (two) times daily. 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 Refilled  methidazole Advised to schedule appt with Dr. Burney Gauze for additional refills.  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 You have a cardiovascular disease risk of 8.1%. Maintain DASH diet and regular exercise. Will repeat in 1year (fasting)       Problem List Items Addressed This Visit      Endocrine   Hyperthyroidism    Stable thyroid function. Refilled methidazole Advised to schedule appt with Dr. Burney Gauze for additional refills.      Relevant Medications   methimazole (TAPAZOLE) 10 MG tablet   Other Relevant Orders   Ambulatory referral to Endocrinology   TSH (Completed)   T4, free (Completed)   T3, free (Completed)     Other   Asymptomatic age-related postmenopausal state   Relevant Orders   Vitamin D 1,25 dihydroxy   DG Bone Density   Hypertriglyceridemia    Abnormal lipid panel due to elevated trig and LDL. You have a cardiovascular disease risk of 8.1%. Maintain DASH diet and regular exercise. 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    Normal PAP  Need to repeat in 1year       Other Visit Diagnoses    Encounter  for preventative adult health care exam with abnormal findings    -  Primary   Relevant Orders   CBC (Completed)   Comprehensive metabolic panel (Completed)   MM DIGITAL SCREENING BILATERAL   Ambulatory referral to Gastroenterology   Cytology - PAP( [place]) (Completed)   Encounter for screening mammogram for malignant neoplasm of breast       Encounter for Papanicolaou smear for cervical cancer screening       Relevant Orders   Cytology - PAP( [place]) (Completed)   Breast cancer screening by mammogram       Relevant Orders   MM DIGITAL SCREENING BILATERAL   Colon cancer screening       Relevant Orders   Ambulatory referral to Gastroenterology       Follow up: Return in 1 year (on 01/10/2021) for CPE (repeat PAP and lipid panel, fasting).  Wilfred Lacy, NP

## 2020-01-14 DIAGNOSIS — R87811 Vaginal high risk human papillomavirus (HPV) DNA test positive: Secondary | ICD-10-CM | POA: Insufficient documentation

## 2020-01-14 DIAGNOSIS — E781 Pure hyperglyceridemia: Secondary | ICD-10-CM | POA: Insufficient documentation

## 2020-01-14 DIAGNOSIS — Z780 Asymptomatic menopausal state: Secondary | ICD-10-CM | POA: Insufficient documentation

## 2020-01-14 LAB — CYTOLOGY - PAP
Comment: NEGATIVE
Comment: NEGATIVE
Comment: NEGATIVE
Diagnosis: NEGATIVE
HPV 16: NEGATIVE
HPV 18 / 45: POSITIVE — AB
High risk HPV: POSITIVE — AB

## 2020-01-14 NOTE — Assessment & Plan Note (Signed)
Abnormal lipid panel due to elevated trig and LDL. You have a cardiovascular disease risk of 8.1%. Maintain DASH diet and regular exercise. Will repeat in 1year (fasting)

## 2020-01-14 NOTE — Assessment & Plan Note (Signed)
Stable thyroid function. Refilled methidazole Advised to schedule appt with Dr. Burney Gauze for additional refills.

## 2020-01-14 NOTE — Assessment & Plan Note (Signed)
Normal PAP  Need to repeat in 1year

## 2020-01-15 LAB — VITAMIN D 1,25 DIHYDROXY
Vitamin D 1, 25 (OH)2 Total: 46 pg/mL (ref 18–72)
Vitamin D2 1, 25 (OH)2: <8 pg/mL
Vitamin D3 1, 25 (OH)2: 46 pg/mL

## 2020-01-19 ENCOUNTER — Encounter: Payer: Self-pay | Admitting: Gastroenterology

## 2020-02-05 ENCOUNTER — Telehealth: Payer: Self-pay | Admitting: Nurse Practitioner

## 2020-02-05 NOTE — Telephone Encounter (Signed)
According to last lipid panel 12/2019, no need for medication at this time. She needs to make changes to her diet at previously discussed. She needs to schedule video appt to eval acute symptoms.

## 2020-02-05 NOTE — Telephone Encounter (Signed)
Patient's son, Quillian Quince, called and states the patient needs a prescription for her high triglycerides. He states the patient does not speak english and her husband Jacqulyn Bath) is at work today. Patient is also having headache and dizziness today and back pain.

## 2020-02-05 NOTE — Telephone Encounter (Signed)
[place] FYI   Pt was called with interpreter and she verbally understood. Pt stated she had been doing the DASH diet for awhile and she doesn't think its helping. She scheduled and appointment for Monday 02/08/20 at 8am to talk about the dizziness and headaches she is getting. This is the only time she had available due to her husband being her only means of transportation. Its aware interpreter will be needed.

## 2020-02-08 ENCOUNTER — Ambulatory Visit: Payer: 59 | Admitting: Nurse Practitioner

## 2020-02-08 ENCOUNTER — Encounter: Payer: Self-pay | Admitting: Nurse Practitioner

## 2020-02-08 ENCOUNTER — Other Ambulatory Visit: Payer: Self-pay

## 2020-02-08 VITALS — BP 120/82 | HR 72 | Temp 97.0°F | Ht 60.0 in | Wt 160.2 lb

## 2020-02-08 DIAGNOSIS — R519 Headache, unspecified: Secondary | ICD-10-CM | POA: Diagnosis not present

## 2020-02-08 NOTE — Progress Notes (Signed)
Subjective:  Patient ID: Michelle Valenzuela, female    DOB: 1951/08/06  Age: 69 y.o. MRN: [id number]  CC: Headache (pt states shes had headache and dizziness for some time and she states her back and headache, pt worried about triglyercides reports normal BP,)  Headache  This is a chronic problem. The current episode started more than 1 year ago. The problem occurs daily. The problem has been waxing and waning. The pain is located in the bilateral region. The pain does not radiate. The pain quality is similar to prior headaches. The quality of the pain is described as aching. Associated symptoms include dizziness. Pertinent negatives include no back pain, blurred vision, drainage, ear pain, eye pain, eye redness, eye watering, facial sweating, fever, hearing loss, insomnia, loss of balance, muscle aches, nausea, neck pain, numbness, phonophobia, photophobia, rhinorrhea, scalp tenderness, seizures, sinus pressure, sore throat, swollen glands, tingling, tinnitus, visual change, vomiting, weakness or weight loss. The symptoms are aggravated by food (sugary foods). She has tried nothing for the symptoms. The treatment provided moderate relief. Her past medical history is significant for obesity. There is no history of cancer, cluster headaches, hypertension, immunosuppression, migraine headaches, migraines in the family, pseudotumor cerebri, recent head traumas, sinus disease or TMJ.  Unaccompanied Use of Stratus Interpreter: Spanish to [place].  Reviewed past Medical, Social and Family history today.  Outpatient Medications Prior to Visit  Medication Sig Dispense Refill  . aspirin EC 81 MG tablet Take 81 mg by mouth daily.    . methimazole (TAPAZOLE) 10 MG tablet Take 1 tablet (10 mg total) by mouth 2 (two) times daily. Additional refills from Endocrinology 60 tablet 0  . bismuth subsalicylate (PEPTO BISMOL) 262 MG/15ML suspension Take 30 mLs by mouth every 6 (six) hours as needed for indigestion.      No facility-administered medications prior to visit.   ROS See HPI  Objective:  BP 120/82   Pulse 72   Temp (!) 97 F (36.1 C) (Tympanic)   Ht 5' (1.524 m)   Wt 160 lb 3.2 oz (72.7 kg)   SpO2 100%   BMI 31.29 kg/m   BP Readings from Last 3 Encounters:  02/08/20 120/82  01/11/20 122/82  04/22/19 118/82    Wt Readings from Last 3 Encounters:  02/08/20 160 lb 3.2 oz (72.7 kg)  01/11/20 163 lb 9.6 oz (74.2 kg)  04/22/19 163 lb 12.8 oz (74.3 kg)    Physical Exam Vitals reviewed.  Constitutional:      Appearance: She is obese.  Eyes:     Extraocular Movements: Extraocular movements intact.     Pupils: Pupils are equal, round, and reactive to light.  Cardiovascular:     Rate and Rhythm: Normal rate and regular rhythm.     Heart sounds: Normal heart sounds.  Pulmonary:     Effort: Pulmonary effort is normal.     Breath sounds: Normal breath sounds.  Abdominal:     Palpations: Abdomen is soft.     Tenderness: There is no abdominal tenderness.  Musculoskeletal:     Cervical back: Normal range of motion and neck supple.  Skin:    General: Skin is warm and dry.  Neurological:     Mental Status: She is alert and oriented to person, place, and time.     Cranial Nerves: No cranial nerve deficit.  Psychiatric:        Mood and Affect: Mood normal.        Speech: Speech normal.  Behavior: Behavior normal.    Lab Results  Component Value Date   WBC 8.0 01/11/2020   HGB 14.0 01/11/2020   HCT 41.8 01/11/2020   PLT 241.0 01/11/2020   GLUCOSE 100 (H) 01/11/2020   CHOL 192 01/11/2020   TRIG 164.0 (H) 01/11/2020   HDL 45.10 01/11/2020   LDLCALC 114 (H) 01/11/2020   ALT 16 01/11/2020   AST 16 01/11/2020   NA 140 01/11/2020   K 4.3 01/11/2020   CL 106 01/11/2020   CREATININE 0.72 01/11/2020   BUN 18 01/11/2020   CO2 26 01/11/2020   TSH 4.18 01/11/2020    Assessment & Plan:  This visit occurred during the SARS-CoV-2 public health emergency.  Safety  protocols were in place, including screening questions prior to the visit, additional usage of staff PPE, and extensive cleaning of exam room while observing appropriate contact time as indicated for disinfecting solutions.   [name] was seen today for headache.  Diagnoses and all orders for this visit:  Nonintractable episodic headache, unspecified headache type   I am having Melvern Banker maintain her aspirin EC, bismuth subsalicylate, and methimazole.  No orders of the defined types were placed in this encounter.   Problem List Items Addressed This Visit    None    Visit Diagnoses    Nonintractable episodic headache, unspecified headache type    -  Primary       Follow-up: Return if symptoms worsen or fail to improve.  Wilfred Lacy, NP

## 2020-02-08 NOTE — Patient Instructions (Addendum)
Endocrinology tried to reach you twice and left voice messages to call back Call their office and schedule an appt:[phone].  Avoid headache trigger Use tylenol 500mg  1tab every 6hrs as needed for headache  Schedule appt with ophthalmology for annual eye exam.   Dolor de cabeza general sin causa General Headache Without Cause El dolor de cabeza es un dolor o Tree surgeon que se siente en la zona de la cabeza o del cuello. Hay muchas causas y tipos de dolores de [place]. En algunos casos, es posible que no se encuentre la causa. Siga estas indicaciones en su casa: Controle su afeccin para detectar cualquier cambio. Infrmele a su mdico acerca de los cambios. Siga estos pasos para Building surveyor afeccin: Control del [employer] medicamentos de venta libre y los recetados solamente como se lo haya indicado el mdico.  Cuando sienta dolor de cabeza acustese en un cuarto oscuro y tranquilo.  Si se lo indican, aplquese hielo en la cabeza y en la zona del cuello: ? Ponga el hielo en una bolsa plstica. ? Coloque una [employer] piel y Therapist, nutritional. ? Coloque el hielo durante 74minutos, 2a3veces al da.  Si se lo indican, aplique calor en la zona afectada. Use la fuente de calor que el mdico le recomiende, como una compresa de calor hmedo o una almohadilla trmica. ? Coloque una [employer] piel y la fuente de Freight forwarder. ? Aplique calor durante 20 a 32minutos. ? Retire la fuente de calor si la piel se pone de color rojo brillante. Esto es muy importante si no puede Education officer, environmental, calor o fro. Puede correr un riesgo mayor de sufrir quemaduras.  [place] luces tenues si las luces brillantes le molestan o sus dolores de cabeza [place]. Comida y bebida  Mantenga un horario para las comidas.  Si bebe alcohol: ? Limite la cantidad que bebe a lo siguiente:  De 0 a 1 medida por da para las mujeres.  De 0 a 2 medidas por da para los hombres. ? Est atento a la  cantidad de alcohol que hay en las bebidas que toma. En los [place], una medida equivale a una botella de cerveza de 12oz (350ml), un vaso de vino de 5oz (178ml) o un vaso de una bebida alcohlica de alta graduacin de 1oz (45ml).  Deje de tomar cafena o reduzca la cantidad que consume. Indicaciones generales   Lleve un registro diario para averiguar si ciertas cosas provocan los dolores de [place]. Registre, por ejemplo, lo siguiente: ? Lo que usted come y bebe. ? El tiempo que duerme. ? Algn cambio en su dieta o en los medicamentos.  Hgase masajes o pruebe otras formas de relajarse.  Limite el estrs.  Sintese con la espalda recta. No contraiga (tensione) los msculos.  No consuma ningn producto que contenga nicotina o tabaco. Estos incluyen los cigarrillos, el tabaco para Higher education careers adviser y los Psychologist, sport and exercise. Si necesita ayuda para dejar de fumar, consulte al mdico.  Haga ejercicios con regularidad tal como se lo indic el mdico.  Duerma lo suficiente. Esto a menudo significa entre 7 y 9horas de sueo cada noche.  Concurra a todas las visitas de control como se lo haya indicado el mdico. Esto es importante. Comunquese con un mdico si:  Los medicamentos no logran [employer].  Tiene un dolor de cabeza que es diferente a los otros dolores de [place].  Tiene malestar estomacal (nuseas) o vomita.  Tiene fiebre. Solicite  ayuda inmediatamente si:  El dolor de cabeza empeora rpidamente.  El dolor empeora despus de hacer mucha actividad fsica.  Sigue vomitando.  Presenta rigidez en el cuello.  Tiene dificultad para ver.  Tiene dificultad para hablar.  Siente dolor en el ojo o en el odo.  Sus msculos estn dbiles, o pierde el control muscular.  Pierde el equilibrio o tiene problemas para Writer.  Siente que va a desvanecerse (perder el conocimiento) o se desmaya.  Est desorientado (confundido).  Tiene una  convulsin. Resumen  El dolor de cabeza es un dolor o Tree surgeon que se siente en la zona de la cabeza o del cuello.  Hay muchas causas y tipos de dolores de [place]. En algunos casos, es posible que no se encuentre la causa.  Lleve un diario como ayuda para [employer] causa de los dolores de [place]. Controle su afeccin para Actuary cambio. Infrmele a su mdico acerca de los cambios.  Comunquese con un mdico si tiene un dolor de cabeza que es diferente de lo habitual o si el dolor de cabeza no se alivia con los medicamentos.  Solicite ayuda de inmediato si el dolor de cabeza es muy intenso, vomita, tiene dificultad para ver, pierde el equilibrio o tiene una convulsin. Esta informacin no tiene Marine scientist el consejo del mdico. Asegrese de hacerle al mdico cualquier pregunta que tenga. Document Revised: 05/21/2018 Document Reviewed: 05/21/2018 Elsevier Patient Education  2020 [employer].

## 2020-02-08 NOTE — Assessment & Plan Note (Signed)
Patient identifies sugar as trigger Describes as pressure and associates with dizziness. Chronic, waxing and waxing, increase in frequency, denies any other associated symptoms. She though this might be related to elevated triglycerides.  I reassured Michelle Valenzuela that her symptoms are not related to elevated triglycerides. Since she was able to identify trigger, I recommended for her to avoid artificial and high sugar foods. She is to use tylenol or ibuprofen for headache. I also advised to get annual ophthalmology exam. She verbalized understanding.

## 2020-02-10 ENCOUNTER — Encounter: Payer: 59 | Admitting: Gastroenterology

## 2020-02-10 ENCOUNTER — Other Ambulatory Visit: Payer: Self-pay | Admitting: Nurse Practitioner

## 2020-02-10 DIAGNOSIS — E0590 Thyrotoxicosis, unspecified without thyrotoxic crisis or storm: Secondary | ICD-10-CM

## 2020-02-18 ENCOUNTER — Ambulatory Visit (AMBULATORY_SURGERY_CENTER): Payer: Self-pay | Admitting: *Deleted

## 2020-02-18 ENCOUNTER — Other Ambulatory Visit: Payer: Self-pay

## 2020-02-18 VITALS — Ht 60.0 in | Wt 162.2 lb

## 2020-02-18 DIAGNOSIS — Z1211 Encounter for screening for malignant neoplasm of colon: Secondary | ICD-10-CM

## 2020-02-18 MED ORDER — SUPREP BOWEL PREP KIT 17.5-3.13-1.6 GM/177ML PO SOLN: 1.0000 | Freq: Once | ORAL | 0 refills | Status: AC

## 2020-02-18 MED FILL — SUPREP BOWEL PREP KIT: 17.5-3.13-1 | 1 days supply | Qty: 354 | Fill #0

## 2020-02-18 NOTE — Progress Notes (Signed)
Patient is Spanish speaking, used Cabin crew for [employer] appointment.  Patient denies any allergies to egg or soy products. Patient denies complications with anesthesia/sedation.  Patient denies oxygen use at home and denies diet medications. Emmi instructions for colonoscopy explained and given to patient.  All questions and concerns were answered.

## 2020-03-07 ENCOUNTER — Encounter: Payer: 59 | Admitting: Gastroenterology

## 2020-03-25 DIAGNOSIS — K5792 Diverticulitis of intestine, part unspecified, without perforation or abscess without bleeding: Secondary | ICD-10-CM | POA: Insufficient documentation

## 2021-01-26 DIAGNOSIS — K0510 Chronic gingivitis, plaque induced: Secondary | ICD-10-CM | POA: Insufficient documentation

## 2021-01-26 DIAGNOSIS — R42 Dizziness and giddiness: Secondary | ICD-10-CM | POA: Insufficient documentation

## 2021-06-14 ENCOUNTER — Other Ambulatory Visit: Payer: Self-pay

## 2021-06-14 DIAGNOSIS — N644 Mastodynia: Secondary | ICD-10-CM

## 2021-06-25 ENCOUNTER — Observation Stay (HOSPITAL_COMMUNITY)
Admission: EM | Admit: 2021-06-25 | Discharge: 2021-06-28 | Disposition: A | Discharge status: Home / Self Care | Payer: Medicaid Other | Attending: General Surgery | Admitting: General Surgery

## 2021-06-25 ENCOUNTER — Emergency Department (HOSPITAL_COMMUNITY): Payer: Medicaid Other

## 2021-06-25 ENCOUNTER — Encounter (HOSPITAL_COMMUNITY): Payer: Self-pay

## 2021-06-25 ENCOUNTER — Other Ambulatory Visit: Payer: Self-pay

## 2021-06-25 DIAGNOSIS — Z7982 Long term (current) use of aspirin: Secondary | ICD-10-CM | POA: Diagnosis not present

## 2021-06-25 DIAGNOSIS — R300 Dysuria: Secondary | ICD-10-CM

## 2021-06-25 DIAGNOSIS — Z20822 Contact with and (suspected) exposure to covid-19: Secondary | ICD-10-CM | POA: Diagnosis not present

## 2021-06-25 DIAGNOSIS — K819 Cholecystitis, unspecified: Secondary | ICD-10-CM

## 2021-06-25 DIAGNOSIS — Z9049 Acquired absence of other specified parts of digestive tract: Secondary | ICD-10-CM | POA: Diagnosis present

## 2021-06-25 DIAGNOSIS — R109 Unspecified abdominal pain: Secondary | ICD-10-CM | POA: Diagnosis present

## 2021-06-25 DIAGNOSIS — K8000 Calculus of gallbladder with acute cholecystitis without obstruction: Secondary | ICD-10-CM | POA: Diagnosis present

## 2021-06-25 DIAGNOSIS — K8012 Calculus of gallbladder with acute and chronic cholecystitis without obstruction: Secondary | ICD-10-CM | POA: Diagnosis not present

## 2021-06-25 LAB — COMPREHENSIVE METABOLIC PANEL
ALT: 16 U/L (ref 0–44)
AST: 18 U/L (ref 15–41)
Albumin: 3.7 g/dL (ref 3.5–5.0)
Alkaline Phosphatase: 86 U/L (ref 38–126)
Anion gap: 7 (ref 5–15)
BUN: 13 mg/dL (ref 8–23)
CO2: 22 mmol/L (ref 22–32)
Calcium: 9.4 mg/dL (ref 8.9–10.3)
Chloride: 107 mmol/L (ref 98–111)
Creatinine, Ser: 0.74 mg/dL (ref 0.44–1.00)
GFR, Estimated: >60 mL/min (ref >60)
Glucose, Bld: 110 mg/dL — ABNORMAL HIGH (ref 70–99)
Potassium: 4.1 mmol/L (ref 3.5–5.1)
Sodium: 136 mmol/L (ref 135–145)
Total Bilirubin: 1 mg/dL (ref 0.3–1.2)
Total Protein: 6.8 g/dL (ref 6.5–8.1)

## 2021-06-25 LAB — CBC
HCT: 42.1 % (ref 36.0–46.0)
HCT: 40.9 % (ref 36.0–46.0)
Hemoglobin: 13.8 g/dL (ref 12.0–15.0)
Hemoglobin: 13.9 g/dL (ref 12.0–15.0)
MCH: 29.6 pg (ref 26.0–34.0)
MCH: 30.3 pg (ref 26.0–34.0)
MCHC: 32.8 g/dL (ref 30.0–36.0)
MCHC: 34 g/dL (ref 30.0–36.0)
MCV: 90.3 fL (ref 80.0–100.0)
MCV: 89.3 fL (ref 80.0–100.0)
Platelets: 256 10*3/uL (ref 150–400)
Platelets: 249 10*3/uL (ref 150–400)
RBC: 4.66 MIL/uL (ref 3.87–5.11)
RBC: 4.58 MIL/uL (ref 3.87–5.11)
RDW: 12.4 % (ref 11.5–15.5)
RDW: 12.4 % (ref 11.5–15.5)
WBC: 10 10*3/uL (ref 4.0–10.5)
WBC: 8.4 10*3/uL (ref 4.0–10.5)
nRBC: 0 % (ref 0.0–0.2)
nRBC: 0 % (ref 0.0–0.2)

## 2021-06-25 LAB — URINALYSIS, ROUTINE W REFLEX MICROSCOPIC
Bilirubin Urine: NEGATIVE
Glucose, UA: NEGATIVE mg/dL
Hgb urine dipstick: NEGATIVE
Ketones, ur: NEGATIVE mg/dL
Nitrite: NEGATIVE
Protein, ur: NEGATIVE mg/dL
Specific Gravity, Urine: 1.005 (ref 1.005–1.030)
pH: 6 (ref 5.0–8.0)

## 2021-06-25 LAB — CREATININE, SERUM
Creatinine, Ser: 0.76 mg/dL (ref 0.44–1.00)
GFR, Estimated: >60 mL/min (ref >60)

## 2021-06-25 LAB — LIPASE, BLOOD: Lipase: 39 U/L (ref 11–51)

## 2021-06-25 LAB — RESP PANEL BY RT-PCR (FLU A&B, COVID) ARPGX2
Influenza A by PCR: NEGATIVE
Influenza B by PCR: NEGATIVE
SARS Coronavirus 2 by RT PCR: NEGATIVE

## 2021-06-25 MED ORDER — METOPROLOL TARTRATE 5 MG/5ML IV SOLN: 5.0000 mg | Freq: Four times a day (QID) | INTRAVENOUS | Status: DC | PRN

## 2021-06-25 MED ORDER — ACETAMINOPHEN 650 MG RE SUPP: 650.0000 mg | Freq: Four times a day (QID) | RECTAL | Status: DC | PRN

## 2021-06-25 MED ORDER — DIPHENHYDRAMINE HCL 12.5 MG/5ML PO ELIX: 12.5000 mg | ORAL_SOLUTION | Freq: Four times a day (QID) | ORAL | Status: DC | PRN

## 2021-06-25 MED ORDER — MELATONIN 3 MG PO TABS: 3.0000 mg | ORAL_TABLET | Freq: Every evening | ORAL | Status: DC | PRN

## 2021-06-25 MED ORDER — IOHEXOL 300 MG/ML  SOLN
100.0000 mL | Freq: Once | INTRAMUSCULAR | Status: AC | PRN
  Administered 2021-06-25: 100 mL via INTRAVENOUS

## 2021-06-25 MED ORDER — SODIUM CHLORIDE 0.9 % IV SOLN
2.0000 g | INTRAVENOUS | Status: DC
  Administered 2021-06-26 – 2021-06-27 (×2): 2 g via INTRAVENOUS
  Filled 2021-06-25 (×3): qty 20

## 2021-06-25 MED ORDER — PROCHLORPERAZINE EDISYLATE 10 MG/2ML IJ SOLN
5.0000 mg | Freq: Four times a day (QID) | INTRAMUSCULAR | Status: DC | PRN
  Administered 2021-06-27: 10 mg via INTRAVENOUS
  Filled 2021-06-25: qty 2

## 2021-06-25 MED ORDER — SENNA 8.6 MG PO TABS
1.0000 | ORAL_TABLET | Freq: Two times a day (BID) | ORAL | Status: DC
  Administered 2021-06-25 – 2021-06-28 (×5): 8.6 mg via ORAL
  Filled 2021-06-25 (×5): qty 1

## 2021-06-25 MED ORDER — ONDANSETRON 4 MG PO TBDP: 4.0000 mg | ORAL_TABLET | Freq: Four times a day (QID) | ORAL | Status: DC | PRN

## 2021-06-25 MED ORDER — MORPHINE SULFATE (PF) 2 MG/ML IV SOLN
1.0000 mg | INTRAVENOUS | Status: DC | PRN
  Administered 2021-06-27: 2 mg via INTRAVENOUS
  Filled 2021-06-25: qty 1

## 2021-06-25 MED ORDER — CIPROFLOXACIN HCL 500 MG PO TABS
500.0000 mg | ORAL_TABLET | Freq: Once | ORAL | Status: DC
  Filled 2021-06-25: qty 1

## 2021-06-25 MED ORDER — PROCHLORPERAZINE MALEATE 5 MG PO TABS: 10.0000 mg | ORAL_TABLET | Freq: Four times a day (QID) | ORAL | Status: DC | PRN

## 2021-06-25 MED ORDER — SODIUM CHLORIDE 0.9 % IV SOLN: 2.0000 g | INTRAVENOUS | Status: DC

## 2021-06-25 MED ORDER — ONDANSETRON HCL 4 MG/2ML IJ SOLN
4.0000 mg | Freq: Four times a day (QID) | INTRAMUSCULAR | Status: DC | PRN
  Filled 2021-06-25: qty 2

## 2021-06-25 MED ORDER — METHIMAZOLE 10 MG PO TABS
10.0000 mg | ORAL_TABLET | Freq: Two times a day (BID) | ORAL | Status: DC
  Administered 2021-06-26 – 2021-06-28 (×2): 10 mg via ORAL
  Filled 2021-06-25 (×8): qty 1

## 2021-06-25 MED ORDER — METRONIDAZOLE 500 MG PO TABS
500.0000 mg | ORAL_TABLET | Freq: Once | ORAL | Status: DC
  Filled 2021-06-25: qty 1

## 2021-06-25 MED ORDER — METHOCARBAMOL 500 MG PO TABS: 500.0000 mg | ORAL_TABLET | Freq: Four times a day (QID) | ORAL | Status: DC | PRN

## 2021-06-25 MED ORDER — DIPHENHYDRAMINE HCL 50 MG/ML IJ SOLN: 12.5000 mg | Freq: Four times a day (QID) | INTRAMUSCULAR | Status: DC | PRN

## 2021-06-25 MED ORDER — OXYCODONE HCL 5 MG PO TABS: 5.0000 mg | ORAL_TABLET | ORAL | Status: DC | PRN

## 2021-06-25 MED ORDER — LACTATED RINGERS IV BOLUS
500.0000 mL | Freq: Once | INTRAVENOUS | Status: AC
  Administered 2021-06-25: 500 mL via INTRAVENOUS

## 2021-06-25 MED ORDER — ACETAMINOPHEN 325 MG PO TABS: 650.0000 mg | ORAL_TABLET | Freq: Four times a day (QID) | ORAL | Status: DC | PRN

## 2021-06-25 MED ORDER — KCL IN DEXTROSE-NACL 20-5-0.45 MEQ/L-%-% IV SOLN
INTRAVENOUS | Status: DC
  Filled 2021-06-25 (×6): qty 1000

## 2021-06-25 MED ORDER — SODIUM CHLORIDE 0.9 % IV SOLN
2.0000 g | Freq: Once | INTRAVENOUS | Status: AC
  Administered 2021-06-25: 2 g via INTRAVENOUS
  Filled 2021-06-25: qty 20

## 2021-06-25 MED ORDER — ENOXAPARIN SODIUM 40 MG/0.4ML IJ SOSY
40.0000 mg | PREFILLED_SYRINGE | INTRAMUSCULAR | Status: DC
  Administered 2021-06-27: 40 mg via SUBCUTANEOUS
  Filled 2021-06-25: qty 0.4

## 2021-06-25 NOTE — ED Notes (Signed)
General surgery paged to RN Sarah per her request

## 2021-06-25 NOTE — ED Notes (Signed)
Patient took personally owned latanoprost with MD approval at this time.

## 2021-06-25 NOTE — ED Notes (Signed)
Patient states she is unable to take ordered PO ABX at this time until she has her latanoprost. Provider made aware.

## 2021-06-25 NOTE — ED Notes (Signed)
Patient transported to Ultrasound 

## 2021-06-25 NOTE — ED Triage Notes (Signed)
Pt stated having pain that started in abdomen and has radiated to the right side and to her back. Pt has no SOB at this time. Pt states the pain is a 9/10 and that it feels like the pain is gaseous and endorses a hx of diverticulitis for the past two years. Pt stated the pain started after a meal. Pain started at midnight in her back and then radiated to the mid lower abdomen.

## 2021-06-25 NOTE — ED Provider Notes (Signed)
[facility] DEPART[facility] Provider Note   CSN: [id number] Arrival date & time: 06/25/21  1751     History Chief Complaint  Patient presents with   Abdominal Pain    Aron Needles is a 70 y.o. female with history of hypothyroidism, diverticulitis in 2019 (no perfect/no abscess), recurrent UTI presents with complaint of abdominal pain that began 1 week ago.  Patient states that the pain initially began as a dull ache in the lower abdomen and progressed to a diffuse abdominal pain that radiated to the upper back.  Patient states the pain is worse on the left side of her abdomen described as 8 out of 10 stabbing pain.  Patient denies nausea, vomiting, diarrhea, fever or chills.  Patient states that her symptoms are similar to her episode of diverticulitis in 2019.  However, she reports the pain is more severe this time.  Patient also complains of dysuria and frequency.  Patient's last bowel movement was this morning and reports decrease in caliber of stool.   Patient was last treated with a UTI 8 months ago.  Patient has not had a updated colonoscopy but reports has one scheduled for this month but unable to obtain due to insurance issues.   Abdominal Pain Associated symptoms: dysuria   Associated symptoms: no chest pain, no chills, no constipation, no diarrhea, no fever, no nausea, no shortness of breath and no vomiting       Past Medical History:  Diagnosis Date   Cataracts, bilateral    MD just watching    Hemorrhoids    HSV infection    Thyroid disease     Patient Active Problem List   Diagnosis Date Noted   Nonintractable episodic headache 02/08/2020   Vaginal high risk HPV DNA test positive 01/14/2020   Asymptomatic age-related postmenopausal state 01/14/2020   Hypertriglyceridemia 01/14/2020   Left thyroid nodule 01/28/2019   Hyperthyroidism 01/28/2019    Past Surgical History:  Procedure Laterality Date   HEMORRHOID SURGERY     OVARIAN  CYST SURGERY     multiple cysts   UPPER GASTROINTESTINAL ENDOSCOPY       OB History     Gravida  4   Para      Term      Preterm      AB      Living  4      SAB      IAB      Ectopic      Multiple      Live Births  4           Family History  Problem Relation Age of Onset   Diabetes Neg Hx    Hypertension Neg Hx    Colon cancer Neg Hx    Rectal cancer Neg Hx    Stomach cancer Neg Hx     Social History   Tobacco Use   Smoking status: Never   Smokeless tobacco: Never  Vaping Use   Vaping Use: Never used  Substance Use Topics   Alcohol use: Not Currently   Drug use: Never    Home Medications Prior to Admission medications   Medication Sig Start Date End Date Taking? Authorizing Provider  aspirin EC 81 MG tablet Take 81 mg by mouth daily.    [provider]  methimazole (TAPAZOLE) 10 MG tablet Take 1 tablet (10 mg total) by mouth 2 (two) times daily. Additional refills from Endocrinology 01/11/20 02/10/20  Nche, Charlene Brooke, NP  Allergies    Patient has no known allergies.  Review of Systems   Review of Systems  Constitutional:  Negative for chills and fever.  Respiratory:  Negative for shortness of breath.   Cardiovascular:  Negative for chest pain.  Gastrointestinal:  Positive for abdominal pain. Negative for constipation, diarrhea, nausea and vomiting.       Stool caliber change   Genitourinary:  Positive for dysuria and frequency.  Musculoskeletal:  Positive for back pain (upper back).   Physical Exam Updated Vital Signs BP (!) 146/97   Pulse 79   Temp 98.5 F (36.9 C) (Oral)   Resp 15   Ht 4' 11.06" (1.5 m)   Wt 74.8 kg   LMP  (LMP Unknown)   SpO2 100%   BMI 33.26 kg/m   Physical Exam Constitutional:      General: She is not in acute distress.    Appearance: She is overweight.  HENT:     Head: Normocephalic and atraumatic.  Cardiovascular:     Rate and Rhythm: Normal rate.     Heart sounds: Normal heart  sounds.  Pulmonary:     Effort: Pulmonary effort is normal.     Breath sounds: Normal breath sounds.  Abdominal:     General: Abdomen is protuberant. Bowel sounds are normal.     Tenderness: There is abdominal tenderness in the epigastric area. There is no guarding or rebound.  Musculoskeletal:     Right lower leg: No edema.     Left lower leg: No edema.  Skin:    General: Skin is warm and dry.  Neurological:     General: No focal deficit present.     Mental Status: She is alert.  Psychiatric:        Behavior: Behavior normal. Behavior is cooperative.    ED Results / Procedures / Treatments   Labs (all labs ordered are listed, but only abnormal results are displayed) Labs Reviewed  COMPREHENSIVE METABOLIC PANEL - Abnormal; Notable for the following components:      Result Value   Glucose, Bld 110 (*)    All other components within normal limits  URINALYSIS, ROUTINE W REFLEX MICROSCOPIC - Abnormal; Notable for the following components:   Color, Urine STRAW (*)    Leukocytes,Ua SMALL (*)    Bacteria, UA RARE (*)    All other components within normal limits  LIPASE, BLOOD  CBC    EKG None  Radiology No results found.  Procedures Procedures   Medications Ordered in ED Medications  metroNIDAZOLE (FLAGYL) tablet 500 mg (has no administration in time range)  ciprofloxacin (CIPRO) tablet 500 mg (has no administration in time range)  lactated ringers bolus 500 mL (has no administration in time range)    ED Course  I have reviewed the triage vital signs and the nursing notes.  Pertinent labs & imaging results that were available during my care of the patient were reviewed by me and considered in my medical decision making (see chart for details).    MDM Rules/Calculators/A&P                           Possible Uncomplicated Diverticulitis Acute cystitis Patient presents with diffuse abdominal pain that radiates to the upper back, mostly worse on left side of abd  described as 8/10 stabbing pain. Patient has history of diverticulitis in 2019, patient reports symptoms are similar.  Patient started on metronidazole and ciprofloxacin.  IV LR  bolus administered. Patient declines pain medication at this time.  Lipase within normal limits; CBC reassuring; CMP insignificant; vital stable; UA reveals small leukocytes and rare bacteria.  Patient reports dysuria and frequency.  CT scan of abdominal and pelvis did not show evidence of diverticulitis.  Did show findings of acute on chronic cholecystitis.  Right upper quadrant ultrasound ordered.  Renal ultrasound ordered due to urinary symptoms. Final Clinical Impression(s) / ED Diagnoses Final diagnoses:  None    Rx / DC Orders ED Discharge Orders     None        Timothy Lasso, MD 06/25/21 1455    Isla Pence, MD 06/25/21 1547

## 2021-06-25 NOTE — H&P (Signed)
Michelle Valenzuela is an 70 y.o. female.   Chief Complaint: RUQ abdominal pain HPI:  Pt is a 70 yo F who came to the emergency department with around 1 week of abdominal pain, becoming quite severe in the last 24 hours.   It started lower in her abdomen and felt like it might be diverticulitis which she has had in the past.  The patient also complained of dysuria.  Given her presentation, it was presumed that she had diverticulitis and a CT was done. However, then pain then moved to the epigastrium and moved around her right side going to her back.  Her CT ended up showing gallbladder inflammation and an ultrasound confirmed gallstones, thickened GB wall, and small amount of pericholecystic fluid.  There was a sonographic murphy's sign. She has continued to require pain meds in the ED.  She is unaware of any family members having gallbladder issues.  She does think that she has had mild symptoms on and off for several months.    History obtained with video interpreter.    Past Medical History:  Diagnosis Date   Cataracts, bilateral    MD just watching    Hemorrhoids    HSV infection    Thyroid disease     Past Surgical History:  Procedure Laterality Date   HEMORRHOID SURGERY     OVARIAN CYST SURGERY     multiple cysts   UPPER GASTROINTESTINAL ENDOSCOPY      Family History  Problem Relation Age of Onset   Diabetes Neg Hx    Hypertension Neg Hx    Colon cancer Neg Hx    Rectal cancer Neg Hx    Stomach cancer Neg Hx    Social History:  reports that she has never smoked. She has never used smokeless tobacco. She reports that she does not currently use alcohol. She reports that she does not use drugs.  Allergies: No Known Allergies  No outpatient medications have been marked as taking for the 06/25/21 encounter [facility] Encounter).      Results for orders placed or performed during the hospital encounter of 06/25/21 (from the past 48 hour(s))  Urinalysis, Routine w reflex  microscopic Urine, Clean Catch     Status: Abnormal   Collection Time: 06/25/21  6:06 AM  Result Value Ref Range   Color, Urine STRAW (A) YELLOW   APPearance CLEAR CLEAR   Specific Gravity, Urine 1.005 1.005 - 1.030   pH 6.0 5.0 - 8.0   Glucose, UA NEGATIVE NEGATIVE mg/dL   Hgb urine dipstick NEGATIVE NEGATIVE   Bilirubin Urine NEGATIVE NEGATIVE   Ketones, ur NEGATIVE NEGATIVE mg/dL   Protein, ur NEGATIVE NEGATIVE mg/dL   Nitrite NEGATIVE NEGATIVE   Leukocytes,Ua SMALL (A) NEGATIVE   RBC / HPF 0-5 0 - 5 RBC/hpf   WBC, UA 0-5 0 - 5 WBC/hpf   Bacteria, UA RARE (A) NONE SEEN   Squamous Epithelial / LPF 0-5 0 - 5    Comment: Performed at [facility], [address] [place], [place] [address]  Lipase, blood     Status: None   Collection Time: 06/25/21  6:23 AM  Result Value Ref Range   Lipase 39 11 - 51 U/L    Comment: Performed at [facility] [address], [place], [place] [address]  Comprehensive metabolic panel     Status: Abnormal   Collection Time: 06/25/21  6:23 AM  Result Value Ref Range   Sodium 136 135 -  145 mmol/L   Potassium 4.1 3.5 - 5.1 mmol/L   Chloride 107 98 - 111 mmol/L   CO2 22 22 - 32 mmol/L   Glucose, Bld 110 (H) 70 - 99 mg/dL    Comment: Glucose reference range applies only to samples taken after fasting for at least 8 hours.   BUN 13 8 - 23 mg/dL   Creatinine, Ser 0.74 0.44 - 1.00 mg/dL   Calcium 9.4 8.9 - 10.3 mg/dL   Total Protein 6.8 6.5 - 8.1 g/dL   Albumin 3.7 3.5 - 5.0 g/dL   AST 18 15 - 41 U/L   ALT 16 0 - 44 U/L   Alkaline Phosphatase 86 38 - 126 U/L   Total Bilirubin 1.0 0.3 - 1.2 mg/dL   GFR, Estimated >60 >60 mL/min    Comment: (NOTE) Calculated using the CKD-EPI Creatinine Equation (2021)    Anion gap 7 5 - 15    Comment: Performed at [facility] [address], [place], [place] [address]  CBC     Status: None   Collection Time: 06/25/21  6:23 AM  Result Value Ref Range   WBC 10.0 4.0 - 10.5 K/uL   RBC 4.66  3.87 - 5.11 MIL/uL   Hemoglobin 13.8 12.0 - 15.0 g/dL   HCT 42.1 36.0 - 46.0 %   MCV 90.3 80.0 - 100.0 fL   MCH 29.6 26.0 - 34.0 pg   MCHC 32.8 30.0 - 36.0 g/dL   RDW 12.4 11.5 - 15.5 %   Platelets 256 150 - 400 K/uL   nRBC 0.0 0.0 - 0.2 %    Comment: Performed at [facility], [place] [address], [place], [place] [address]  Resp Panel by RT-PCR (Flu A&B, Covid) Nasopharyngeal Swab     Status: None   Collection Time: 06/25/21  3:34 PM   Specimen: Nasopharyngeal Swab; Nasopharyngeal(NP) swabs in vial transport medium  Result Value Ref Range   SARS Coronavirus 2 by RT PCR NEGATIVE NEGATIVE    Comment: (NOTE) SARS-CoV-2 target nucleic acids are NOT DETECTED.  The SARS-CoV-2 RNA is generally detectable in upper respiratory specimens during the acute phase of infection. The lowest concentration of SARS-CoV-2 viral copies this assay can detect is 138 copies/mL. A negative result does not preclude SARS-Cov-2 infection and should not be used as the sole basis for treatment or other patient management decisions. A negative result may occur with  improper specimen collection/handling, submission of specimen other than nasopharyngeal swab, presence of viral mutation(s) within the areas targeted by this assay, and inadequate number of viral copies(<138 copies/mL). A negative result must be combined with clinical observations, patient history, and epidemiological information. The expected result is Negative.  Fact Sheet for Patients:  EntrepreneurPulse.com.au  Fact Sheet for Healthcare Providers:  IncredibleEmployment.be  This test is no t yet approved or cleared by the [place] FDA and  has been authorized for detection and/or diagnosis of SARS-CoV-2 by FDA under an Emergency Use Authorization (EUA). This EUA will remain  in effect (meaning this test can be used) for the duration of the COVID-19 declaration under Section 564(b)(1) of the Act,  21 U.S.C.section 360bbb-3(b)(1), unless the authorization is terminated  or revoked sooner.       Influenza A by PCR NEGATIVE NEGATIVE   Influenza B by PCR NEGATIVE NEGATIVE    Comment: (NOTE) The Xpert Xpress SARS-CoV-2/FLU/RSV plus assay is intended as an aid in the diagnosis of influenza from Nasopharyngeal swab specimens and  should not be used as a sole basis for treatment. Nasal washings and aspirates are unacceptable for Xpert Xpress SARS-CoV-2/FLU/RSV testing.  Fact Sheet for Patients: EntrepreneurPulse.com.au  Fact Sheet for Healthcare Providers: IncredibleEmployment.be  This test is not yet approved or cleared by the [place] FDA and has been authorized for detection and/or diagnosis of SARS-CoV-2 by FDA under an Emergency Use Authorization (EUA). This EUA will remain in effect (meaning this test can be used) for the duration of the COVID-19 declaration under Section 564(b)(1) of the Act, 21 U.S.C. section 360bbb-3(b)(1), unless the authorization is terminated or revoked.  Performed at [facility], [place] [address], [place], [place] [address]    CT ABDOMEN PELVIS W CONTRAST  Result Date: 06/25/2021 CLINICAL DATA:  Abdominal pain, acute, nonlocalized EXAM: CT ABDOMEN AND PELVIS WITH CONTRAST TECHNIQUE: Multidetector CT imaging of the abdomen and pelvis was performed using the standard protocol following bolus administration of intravenous contrast. CONTRAST:  134mL OMNIPAQUE IOHEXOL 300 MG/ML  SOLN COMPARISON:  April 05, 2018. FINDINGS: Lower chest: No acute abnormality. Hepatobiliary: Cyst of the RIGHT hepatic dome. Additional subcentimeter hypodense lesions are too small to accurately characterize. There is gallbladder wall thickening and mucosal enhancement. Gallbladder wall thickness measures approximately 5-6 mm. This is new since prior. Multiple cholelithiasis. There is possible subtle adjacent fat stranding. No extrahepatic  or intrahepatic biliary ductal dilation. Pancreas: Unremarkable. No pancreatic ductal dilatation or surrounding inflammatory changes. Spleen: Normal in size without focal abnormality. Adrenals/Urinary Tract: Adrenal glands are unremarkable. Kidneys enhance symmetrically. No hydronephrosis. Multifocal RIGHT-sided cortical scarring. Subcentimeter hypodense lesions are too small to accurately characterize. No obstructive nephrolithiasis. Bladder is distended. Stomach/Bowel: Tiny hiatal hernia. No evidence of bowel obstruction. Appendix is normal. Scattered diverticulosis without evidence of acute diverticulitis. Vascular/Lymphatic: Atherosclerotic calcifications. No suspicious lymphadenopathy. Reproductive: Uterus and bilateral adnexa are unremarkable. Other: No free air or free fluid. Musculoskeletal: No acute or significant osseous findings. IMPRESSION: 1. Constellation of findings are favored to reflect chronic cholecystitis with a superimposed acute component. Recommend correlation with physical exam. Aortic Atherosclerosis (ICD10-I70.0). Electronically Signed   By: Valentino Saxon M.D.   On: 06/25/2021 12:55   US RENAL  Result Date: 06/25/2021 CLINICAL DATA:  Dysuria EXAM: RENAL / URINARY TRACT ULTRASOUND COMPLETE COMPARISON:  Abdomen pelvis 06/25/2021 FINDINGS: Right Kidney: Renal measurements: 9.8 x 5.2 x 4.2 cm = volume: 113 mL. Echogenicity within normal limits. No mass or hydronephrosis visualized. Left Kidney: Renal measurements: 9.9 x 4.9 x 4 cm = volume: 101 mL. Echogenicity within normal limits. No mass or hydronephrosis visualized. Bladder: Appears normal for degree of bladder distention. Other: None. IMPRESSION: Unremarkable renal ultrasound. Electronically Signed   By: Iven Finn M.D.   On: 06/25/2021 15:35   US Abdomen Limited RUQ (LIVER/GB)  Result Date: 06/25/2021 CLINICAL DATA:  Abdominal pain. Follow-up from current abdomen and pelvis CT. EXAM: ULTRASOUND ABDOMEN LIMITED RIGHT  UPPER QUADRANT COMPARISON:  Current abdomen and pelvis CT. FINDINGS: Gallbladder: Multiple gallstones. Gallbladder wall measuring up to 6 mm in thickness. Trace amount of pericholecystic fluid. Positive sonographic Murphy sign Common bile duct: Diameter: 4 mm Liver: Mild increased parenchymal echogenicity. No masses. Portal vein is patent on color Doppler imaging with normal direction of blood flow towards the liver. Other: None. IMPRESSION: 1. Findings support acute cholecystitis with gallbladder wall thickening, multiple stones, trace pericholecystic fluid and a positive sonographic Murphy's sign. Electronically Signed   By: Lajean Manes M.D.   On: 06/25/2021 15:25    Review of Systems  All other systems reviewed and are negative.  Blood pressure (!) 110/57, pulse 71, temperature 98.5 F (36.9 C), temperature source Oral, resp. rate 16, height 4' 11.06" (1.5 m), weight 74.8 kg, SpO2 100 %. Physical Exam Constitutional:      General: She is in acute distress.     Appearance: She is well-developed. She is not ill-appearing.  HENT:     Head: Normocephalic and atraumatic.  Eyes:     General: No scleral icterus.    Extraocular Movements: Extraocular movements intact.     Pupils: Pupils are equal, round, and reactive to light.  Cardiovascular:     Rate and Rhythm: Normal rate and regular rhythm.     Heart sounds: Normal heart sounds. No murmur heard.   No friction rub.  Pulmonary:     Effort: Pulmonary effort is normal. No respiratory distress.     Breath sounds: Normal breath sounds. No stridor. No wheezing or rales.  Chest:     Chest wall: No tenderness.  Abdominal:     General: Abdomen is flat. A surgical scar is present. Bowel sounds are decreased. There is no distension. There are no signs of injury.     Palpations: Abdomen is soft. There is no shifting dullness, hepatomegaly, splenomegaly or mass.     Tenderness: There is abdominal tenderness in the right upper quadrant. Positive  signs include Murphy's sign (mild).     Comments: Lower midline scar  Skin:    General: Skin is warm and dry.     Capillary Refill: Capillary refill takes 2 to 3 seconds.     Coloration: Skin is not cyanotic, jaundiced, mottled or pale.     Findings: No erythema or rash.  Neurological:     General: No focal deficit present.     Mental Status: She is alert and oriented to person, place, and time.  Psychiatric:        Mood and Affect: Mood normal. Mood is not anxious or depressed.        Behavior: Behavior normal.     Assessment/Plan Acute calculous cholecystitis  IV fluids IV antibiotics NPO after midnight.   Possible lap chole tomorrow depending on schedule. Will discuss with Dr. Kieth Brightly.  I reviewed gallbladder surgery with the patient via video interpreter.  The surgical procedure was described to the patient in detail.  The patient was given educational material.  I discussed the incision type and location, the location of the gallbladder, the anatomy of the bile ducts and arteries, and the typical progression of surgery.  I discussed the possibility of converting to an open operation.  I advised of the risks of bleeding, infection, damage to other structures (such as the bile duct, intestine or liver), bile leak, need for other procedures or surgeries, and post op diarrhea/constipation.  We discussed the risk of blood clot.  We discussed the recovery period and post operative restrictions.  The patient was advised against taking blood thinners the week before surgery.       Stark Klein, MD 06/25/2021, 4:55 PM

## 2021-06-26 ENCOUNTER — Encounter (HOSPITAL_COMMUNITY): Payer: Self-pay | Admitting: Registered Nurse

## 2021-06-26 ENCOUNTER — Encounter (HOSPITAL_COMMUNITY)
Admission: EM | Disposition: A | Discharge status: Home / Self Care | Payer: Self-pay | Source: Home / Self Care | Attending: Emergency Medicine

## 2021-06-26 ENCOUNTER — Encounter (HOSPITAL_COMMUNITY): Payer: Self-pay

## 2021-06-26 LAB — CBC
HCT: 41.4 % (ref 36.0–46.0)
Hemoglobin: 13.6 g/dL (ref 12.0–15.0)
MCH: 30.2 pg (ref 26.0–34.0)
MCHC: 32.9 g/dL (ref 30.0–36.0)
MCV: 92 fL (ref 80.0–100.0)
Platelets: 237 10*3/uL (ref 150–400)
RBC: 4.5 MIL/uL (ref 3.87–5.11)
RDW: 12.5 % (ref 11.5–15.5)
WBC: 6.7 10*3/uL (ref 4.0–10.5)
nRBC: 0 % (ref 0.0–0.2)

## 2021-06-26 LAB — COMPREHENSIVE METABOLIC PANEL
ALT: 15 U/L (ref 0–44)
AST: 16 U/L (ref 15–41)
Albumin: 3.3 g/dL — ABNORMAL LOW (ref 3.5–5.0)
Alkaline Phosphatase: 85 U/L (ref 38–126)
Anion gap: 7 (ref 5–15)
BUN: 11 mg/dL (ref 8–23)
CO2: 24 mmol/L (ref 22–32)
Calcium: 8.9 mg/dL (ref 8.9–10.3)
Chloride: 107 mmol/L (ref 98–111)
Creatinine, Ser: 0.75 mg/dL (ref 0.44–1.00)
GFR, Estimated: >60 mL/min (ref >60)
Glucose, Bld: 135 mg/dL — ABNORMAL HIGH (ref 70–99)
Potassium: 3.7 mmol/L (ref 3.5–5.1)
Sodium: 138 mmol/L (ref 135–145)
Total Bilirubin: 0.8 mg/dL (ref 0.3–1.2)
Total Protein: 6.1 g/dL — ABNORMAL LOW (ref 6.5–8.1)

## 2021-06-26 SURGERY — LAPAROSCOPIC CHOLECYSTECTOMY
Anesthesia: General

## 2021-06-26 MED ORDER — BUPIVACAINE-EPINEPHRINE (PF) 0.25% -1:200000 IJ SOLN
INTRAMUSCULAR | Status: AC
  Filled 2021-06-26: qty 30

## 2021-06-26 MED ORDER — CHLORHEXIDINE GLUCONATE 0.12 % MT SOLN
OROMUCOSAL | Status: AC
  Filled 2021-06-26: qty 15

## 2021-06-26 MED ORDER — FENTANYL CITRATE (PF) 250 MCG/5ML IJ SOLN
INTRAMUSCULAR | Status: AC
  Filled 2021-06-26: qty 5

## 2021-06-26 NOTE — Progress Notes (Signed)
Pt could not remember when she last ate solid food today 06/26/21, therefore the surgery will be postponed until 06/27/21 for safety, per Dr. Kieth Brightly. Pt and spouse made aware via the video interpreting service. Call placed to Louisa, RN to inform him of their return.

## 2021-06-26 NOTE — Progress Notes (Signed)
Patient unable to confirm when she ate. I saw her at 8 am and she told me she had just finished a sandwich. After reminding her she was still concerned about her stomach being empty for the surgery. We will do the surgery tomorrow to ensure appropriate NPO time.

## 2021-06-26 NOTE — Anesthesia Preprocedure Evaluation (Deleted)
Anesthesia Evaluation    Reviewed: Allergy & Precautions, NPO status , Patient's Chart, lab work & pertinent test results  Airway        Dental   Pulmonary neg pulmonary ROS,           Cardiovascular negative cardio ROS       Neuro/Psych negative neurological ROS     GI/Hepatic negative GI ROS, Neg liver ROS,   Endo/Other  Hyperthyroidism   Renal/GU negative Renal ROS     Musculoskeletal   Abdominal   Peds  Hematology negative hematology ROS (+)   Anesthesia Other Findings   Reproductive/Obstetrics                            Anesthesia Physical Anesthesia Plan  ASA: 2  Anesthesia Plan: General   Post-op Pain Management:    Induction: Intravenous  PONV Risk Score and Plan:   Airway Management Planned:   Additional Equipment:   Intra-op Plan:   Post-operative Plan: Extubation in OR  Informed Consent: I have reviewed the patients History and Physical, chart, labs and discussed the procedure including the risks, benefits and alternatives for the proposed anesthesia with the patient or authorized representative who has indicated his/her understanding and acceptance.     Dental advisory given  Plan Discussed with: CRNA  Anesthesia Plan Comments:         Anesthesia Quick Evaluation

## 2021-06-26 NOTE — ED Notes (Signed)
Report given to Martyn Ehrich, RN

## 2021-06-26 NOTE — Progress Notes (Signed)
The room is still being cleaned. Thanks.

## 2021-06-26 NOTE — ED Notes (Signed)
Used interpretor to introduce self to pt, assess pain, and to use call bell for assistance.

## 2021-06-26 NOTE — Discharge Instructions (Signed)
CIRUGIA LAPAROSCOPICA: INSTRUCCIONES DE POST OPERATORIO.  Revise siempre los documentos que le entreguen en el lugar donde se ha hecho la [place].  SI USTED NECESITA DOCUMENTOS DE INCAPACIDAD (DISABLE) O DE PERMISO FAMILAR (FAMILY LEAVE) NECESITA TRAERLOS A LA OFICINA PARA QUE SEAN PROCESADOS. NO  SE LOS DE A SU DOCTOR. A su alta del hospital se le dara una receta para Financial controller. Tomela como ha sido recetada, si la necesita. Si no la necesita puede tomar, Acetaminofen (Tylenol) o Ibuprofen (Advil) para aliviar dolor moderado. Continue tomando el resto de sus medicinas. Si necesita rellenar la receta, llame a la farmacia. ellos contactan a nuestra oficina pidiendo autorizacion. Este tipo de receta no pueden ser [employer] de las  5pm o [employer] fines de [place]. Con relacion a la dieta: debe ser [employer] primeros dias despues que llege a la casa. Ejemplo: sopas y galleticas. Tome bastante liquido esos dias. La [employer] de los pacientes padecen de inflamacion y cambio de coloracion de la piel alrededor de las incisiones. esto toma dias en resolver.  pnerse una bolsa de hielo en el area affectada ayuda..  Es comun tambien tener un poco de estrenimiento si esta tomado medicinas para Conservation officer, historic buildings. incremente la cantidad de liquidos a tomar y Doctor, hospital (Colace) esto previene el problema. Si ya tiene estrenimiento, es Software engineer no ha defecado en 48 horas, puede tomar un laxativo (Milk of Magnesia or Miralax) uselo como el paquete le explica.  A menos que se le diga algo diferente. Remueva el bendaje a las 24-48 horas despues dela [place]. y puede banarse en la ducha sin ningun problema. usted puede tener steri-strips (pequenas curitas transparentes en la piel puesta encima de la incision)  Estas banditas strips should be left on the skin for 7-10 days.   Si su cirujano puso pegamento encima de la incision usted puede banarse bajo la ducha en 24 horas. Este pegamento empezara a caerse en las  proximas 2-3 semanas. Si le pusieron suturas o presillas (grapos) estos seran quitados en su proxima cita en la oficina. . ACTIVIDADES:  Puede hacer actividad ligera.  Como caminar , subir escaleras y poco a poco irlas incrementando tanto como las [place]. Puede tener relaciones sexuales cuando sea comfortable. No carge objetos pesados o haga esfuerzos que no sean aprovados por su doctor. Puede manejar en cuanto no esta tomando medicamentos fuertes (narcoticos) para Conservation officer, historic buildings, pueda abrochar confortablemente el cinturon de seguridad, y pueda Psychologist, counselling y usar los pedales de su vehiculo con seguridad. PUEDE REGRESAR A TRABAJAR  Debe ver a su doctor para una cita de seguimiento en 2-3 semanas despues de la [place].   CUANDO LLAMAR A SU MEDICO: FIEBRE mayor de  101.0 No produccion de [place]. Sangramiento continue de la herida Incremento de Social research officer, government, enrojecimientio o drenaje de la herida (incision) Incremento de dolor abdominal.  The clinic staff is available to answer your questions during regular business hours.  Please don't hesitate to call and ask to speak to one of the nurses for clinical concerns.  If you have a medical emergency, go to the nearest emergency room or call 911.  A surgeon from [facility] is always on call at the hospital. [address], [place], [place], [place]  [address] ? [address], [place], [place]   [address] [phone] ? [phone] ? FAX [phone] Web site: [email or web address]

## 2021-06-26 NOTE — Progress Notes (Signed)
Pre Procedure note for inpatients:   Raquel Racey has been scheduled for Procedure(s): LAPAROSCOPIC CHOLECYSTECTOMY (N/A) today. The various methods of treatment have been discussed with the patient. After consideration of the risks, benefits and treatment options the patient has consented to the planned procedure.   The patient has been seen and labs reviewed. She at between 6 and 7 am so we will wait until later afternoon or tomorrow. There are no changes in the patient's condition to prevent proceeding with the planned procedure today.  Recent labs:  Lab Results  Component Value Date   WBC 6.7 06/26/2021   HGB 13.6 06/26/2021   HCT 41.4 06/26/2021   PLT 237 06/26/2021   GLUCOSE 110 (H) 06/25/2021   CHOL 192 01/11/2020   TRIG 164.0 (H) 01/11/2020   HDL 45.10 01/11/2020   LDLCALC 114 (H) 01/11/2020   ALT 16 06/25/2021   AST 18 06/25/2021   NA 136 06/25/2021   K 4.1 06/25/2021   CL 107 06/25/2021   CREATININE 0.76 06/25/2021   BUN 13 06/25/2021   CO2 22 06/25/2021   TSH 4.18 01/11/2020    Mickeal Skinner, MD 06/26/2021 9:43 AM

## 2021-06-27 ENCOUNTER — Observation Stay (HOSPITAL_COMMUNITY): Payer: Medicaid Other | Admitting: Anesthesiology

## 2021-06-27 ENCOUNTER — Encounter (HOSPITAL_COMMUNITY): Payer: Self-pay

## 2021-06-27 ENCOUNTER — Encounter (HOSPITAL_COMMUNITY)
Admission: EM | Disposition: A | Discharge status: Home / Self Care | Payer: Self-pay | Source: Home / Self Care | Attending: Emergency Medicine

## 2021-06-27 DIAGNOSIS — Z7982 Long term (current) use of aspirin: Secondary | ICD-10-CM | POA: Diagnosis not present

## 2021-06-27 DIAGNOSIS — K8012 Calculus of gallbladder with acute and chronic cholecystitis without obstruction: Secondary | ICD-10-CM | POA: Diagnosis not present

## 2021-06-27 DIAGNOSIS — Z20822 Contact with and (suspected) exposure to covid-19: Secondary | ICD-10-CM | POA: Diagnosis not present

## 2021-06-27 HISTORY — PX: CHOLECYSTECTOMY: SHX55

## 2021-06-27 LAB — URINE CULTURE: Culture: >=100000 — AB

## 2021-06-27 LAB — SURGICAL PCR SCREEN
MRSA, PCR: NEGATIVE
Staphylococcus aureus: NEGATIVE

## 2021-06-27 SURGERY — LAPAROSCOPIC CHOLECYSTECTOMY
Anesthesia: General | Site: Abdomen

## 2021-06-27 MED ORDER — FENTANYL CITRATE (PF) 250 MCG/5ML IJ SOLN
INTRAMUSCULAR | Status: DC | PRN
  Administered 2021-06-27: 50 ug via INTRAVENOUS
  Administered 2021-06-27: 100 ug via INTRAVENOUS
  Administered 2021-06-27 (×2): 50 ug via INTRAVENOUS

## 2021-06-27 MED ORDER — FENTANYL CITRATE (PF) 250 MCG/5ML IJ SOLN
INTRAMUSCULAR | Status: AC
  Filled 2021-06-27: qty 5

## 2021-06-27 MED ORDER — SUGAMMADEX SODIUM 200 MG/2ML IV SOLN
INTRAVENOUS | Status: DC | PRN
  Administered 2021-06-27: 200 mg via INTRAVENOUS

## 2021-06-27 MED ORDER — ONDANSETRON HCL 4 MG/2ML IJ SOLN
INTRAMUSCULAR | Status: AC
  Filled 2021-06-27: qty 2

## 2021-06-27 MED ORDER — PROPOFOL 10 MG/ML IV BOLUS
INTRAVENOUS | Status: AC
  Filled 2021-06-27: qty 20

## 2021-06-27 MED ORDER — PHENYLEPHRINE HCL-NACL 20-0.9 MG/250ML-% IV SOLN
INTRAVENOUS | Status: DC | PRN
  Administered 2021-06-27: 20 ug/min via INTRAVENOUS

## 2021-06-27 MED ORDER — SODIUM CHLORIDE 0.9 % IR SOLN
Status: DC | PRN
  Administered 2021-06-27: 700 mL

## 2021-06-27 MED ORDER — FENTANYL CITRATE (PF) 100 MCG/2ML IJ SOLN
INTRAMUSCULAR | Status: AC
  Filled 2021-06-27: qty 2

## 2021-06-27 MED ORDER — LIDOCAINE 2% (20 MG/ML) 5 ML SYRINGE
INTRAMUSCULAR | Status: DC | PRN
  Administered 2021-06-27: 60 mg via INTRAVENOUS

## 2021-06-27 MED ORDER — ROCURONIUM BROMIDE 10 MG/ML (PF) SYRINGE
PREFILLED_SYRINGE | INTRAVENOUS | Status: DC | PRN
Start: 2021-06-27 — End: 2021-06-27
  Administered 2021-06-27: 70 mg via INTRAVENOUS

## 2021-06-27 MED ORDER — OXYCODONE HCL 5 MG PO TABS: 5.0000 mg | ORAL_TABLET | Freq: Once | ORAL | Status: DC | PRN

## 2021-06-27 MED ORDER — BUPIVACAINE-EPINEPHRINE (PF) 0.25% -1:200000 IJ SOLN
INTRAMUSCULAR | Status: AC
  Filled 2021-06-27: qty 30

## 2021-06-27 MED ORDER — BUPIVACAINE-EPINEPHRINE 0.25% -1:200000 IJ SOLN
INTRAMUSCULAR | Status: DC | PRN
  Administered 2021-06-27: 30 mL

## 2021-06-27 MED ORDER — ORAL CARE MOUTH RINSE: 15.0000 mL | Freq: Once | OROMUCOSAL | Status: AC

## 2021-06-27 MED ORDER — ROCURONIUM BROMIDE 10 MG/ML (PF) SYRINGE
PREFILLED_SYRINGE | INTRAVENOUS | Status: AC
  Filled 2021-06-27: qty 10

## 2021-06-27 MED ORDER — DEXAMETHASONE SODIUM PHOSPHATE 10 MG/ML IJ SOLN
INTRAMUSCULAR | Status: AC
  Filled 2021-06-27: qty 1

## 2021-06-27 MED ORDER — ONDANSETRON HCL 4 MG/2ML IJ SOLN
4.0000 mg | Freq: Once | INTRAMUSCULAR | Status: AC | PRN
  Administered 2021-06-27: 4 mg via INTRAVENOUS

## 2021-06-27 MED ORDER — OXYCODONE HCL 5 MG/5ML PO SOLN: 5.0000 mg | Freq: Once | ORAL | Status: DC | PRN

## 2021-06-27 MED ORDER — ONDANSETRON HCL 4 MG/2ML IJ SOLN
INTRAMUSCULAR | Status: DC | PRN
  Administered 2021-06-27: 4 mg via INTRAVENOUS

## 2021-06-27 MED ORDER — DEXAMETHASONE SODIUM PHOSPHATE 10 MG/ML IJ SOLN
INTRAMUSCULAR | Status: DC | PRN
  Administered 2021-06-27: 4 mg via INTRAVENOUS

## 2021-06-27 MED ORDER — PROPOFOL 10 MG/ML IV BOLUS
INTRAVENOUS | Status: DC | PRN
  Administered 2021-06-27: 120 mg via INTRAVENOUS

## 2021-06-27 MED ORDER — CHLORHEXIDINE GLUCONATE 0.12 % MT SOLN
15.0000 mL | Freq: Once | OROMUCOSAL | Status: AC
  Administered 2021-06-27: 15 mL via OROMUCOSAL
  Filled 2021-06-27: qty 15

## 2021-06-27 MED ORDER — LACTATED RINGERS IV SOLN: INTRAVENOUS | Status: DC

## 2021-06-27 MED ORDER — FENTANYL CITRATE (PF) 100 MCG/2ML IJ SOLN
25.0000 ug | INTRAMUSCULAR | Status: DC | PRN
  Administered 2021-06-27 (×2): 25 ug via INTRAVENOUS

## 2021-06-27 SURGICAL SUPPLY — 35 items
BAG COUNTER SPONGE SURGICOUNT (BAG) ×2 IMPLANT
BLADE CLIPPER SURG (BLADE) IMPLANT
CANISTER SUCT 3000ML PPV (MISCELLANEOUS) ×2 IMPLANT
CHLORAPREP W/TINT 26 (MISCELLANEOUS) ×2 IMPLANT
CLIP LIGATING HEMO LOK XL GOLD (MISCELLANEOUS) IMPLANT
CLIP LIGATING HEMO O LOK GREEN (MISCELLANEOUS) ×4 IMPLANT
COVER SURGICAL LIGHT HANDLE (MISCELLANEOUS) ×2 IMPLANT
DERMABOND ADVANCED (GAUZE/BANDAGES/DRESSINGS) ×1
DERMABOND ADVANCED .7 DNX12 (GAUZE/BANDAGES/DRESSINGS) ×1 IMPLANT
ELECT REM PT RETURN 9FT ADLT (ELECTROSURGICAL) ×2
ELECTRODE REM PT RTRN 9FT ADLT (ELECTROSURGICAL) ×1 IMPLANT
GLOVE SURG POLYISO LF SZ7 (GLOVE) ×2 IMPLANT
GLOVE SURG UNDER POLY LF SZ7 (GLOVE) ×2 IMPLANT
GOWN STRL REUS W/ TWL LRG LVL3 (GOWN DISPOSABLE) ×4 IMPLANT
GOWN STRL REUS W/TWL LRG LVL3 (GOWN DISPOSABLE) ×4
GRASPER SUT TROCAR 14GX15 (MISCELLANEOUS) ×2 IMPLANT
KIT BASIN OR (CUSTOM PROCEDURE TRAY) ×2 IMPLANT
KIT TURNOVER KIT B (KITS) ×2 IMPLANT
NEEDLE 22X1 1/2 (OR ONLY) (NEEDLE) ×2 IMPLANT
NS IRRIG 1000ML POUR BTL (IV SOLUTION) ×2 IMPLANT
PAD ARMBOARD 7.5X6 YLW CONV (MISCELLANEOUS) ×2 IMPLANT
POUCH RETRIEVAL ECOSAC 10 (ENDOMECHANICALS) ×1 IMPLANT
POUCH RETRIEVAL ECOSAC 10MM (ENDOMECHANICALS) ×1
SCISSORS LAP 5X35 DISP (ENDOMECHANICALS) ×2 IMPLANT
SET IRRIG TUBING LAPAROSCOPIC (IRRIGATION / IRRIGATOR) ×2 IMPLANT
SET TUBE SMOKE EVAC HIGH FLOW (TUBING) ×2 IMPLANT
SLEEVE ENDOPATH XCEL 5M (ENDOMECHANICALS) ×4 IMPLANT
SPECIMEN JAR SMALL (MISCELLANEOUS) ×2 IMPLANT
SUT MNCRL AB 4-0 PS2 18 (SUTURE) ×2 IMPLANT
TOWEL GREEN STERILE (TOWEL DISPOSABLE) ×2 IMPLANT
TOWEL GREEN STERILE FF (TOWEL DISPOSABLE) ×2 IMPLANT
TRAY LAPAROSCOPIC MC (CUSTOM PROCEDURE TRAY) ×2 IMPLANT
TROCAR XCEL 12X100 BLDLESS (ENDOMECHANICALS) ×2 IMPLANT
TROCAR XCEL NON-BLD 5MMX100MML (ENDOMECHANICALS) ×2 IMPLANT
WATER STERILE IRR 1000ML POUR (IV SOLUTION) ×2 IMPLANT

## 2021-06-27 NOTE — Anesthesia Procedure Notes (Signed)
Procedure Name: Intubation Date/Time: 06/27/2021 11:28 AM Performed by: Lowella Dell, CRNA Pre-anesthesia Checklist: Patient identified, Emergency Drugs available, Suction available and Patient being monitored Patient Re-evaluated:Patient Re-evaluated prior to induction Oxygen Delivery Method: Circle System Utilized Preoxygenation: Pre-oxygenation with 100% oxygen Induction Type: IV induction Ventilation: Mask ventilation without difficulty Laryngoscope Size: Mac and 3 Tube type: Oral Tube size: 7.0 mm Number of attempts: 1 Airway Equipment and Method: Stylet Placement Confirmation: ETT inserted through vocal cords under direct vision, positive ETCO2 and breath sounds checked- equal and bilateral Secured at: 21 cm Tube secured with: Tape Dental Injury: Teeth and Oropharynx as per pre-operative assessment

## 2021-06-27 NOTE — Progress Notes (Signed)
Pre Procedure note for inpatients:   Michelle Valenzuela has been scheduled for Procedure(s): LAPAROSCOPIC CHOLECYSTECTOMY (N/A) today. The various methods of treatment have been discussed with the patient. After consideration of the risks, benefits and treatment options the patient has consented to the planned procedure.   The patient has been seen and labs reviewed. There are no changes in the patient's condition to prevent proceeding with the planned procedure today.  Recent labs:  Lab Results  Component Value Date   WBC 6.7 06/26/2021   HGB 13.6 06/26/2021   HCT 41.4 06/26/2021   PLT 237 06/26/2021   GLUCOSE 135 (H) 06/26/2021   CHOL 192 01/11/2020   TRIG 164.0 (H) 01/11/2020   HDL 45.10 01/11/2020   LDLCALC 114 (H) 01/11/2020   ALT 15 06/26/2021   AST 16 06/26/2021   NA 138 06/26/2021   K 3.7 06/26/2021   CL 107 06/26/2021   CREATININE 0.75 06/26/2021   BUN 11 06/26/2021   CO2 24 06/26/2021   TSH 4.18 01/11/2020    Mickeal Skinner, MD 06/27/2021 10:38 AM

## 2021-06-27 NOTE — Anesthesia Postprocedure Evaluation (Signed)
Anesthesia Post Note  Patient: Michelle Valenzuela  Procedure(s) Performed: LAPAROSCOPIC CHOLECYSTECTOMY (Abdomen)     Patient location during evaluation: PACU Anesthesia Type: General Level of consciousness: awake and alert and oriented Pain management: pain level controlled Vital Signs Assessment: post-procedure vital signs reviewed and stable Respiratory status: spontaneous breathing, nonlabored ventilation and respiratory function stable Cardiovascular status: blood pressure returned to baseline and stable Postop Assessment: no apparent nausea or vomiting Anesthetic complications: no   No notable events documented.  Last Vitals:  Vitals:   06/27/21 1300 06/27/21 1315  BP: 126/64 137/84  Pulse: 71 70  Resp: 12 16  Temp:    SpO2: 100% 100%    Last Pain:  Vitals:   06/27/21 1315  TempSrc:   PainSc: 0-No pain                 [name]

## 2021-06-27 NOTE — Transfer of Care (Signed)
Immediate Anesthesia Transfer of Care Note  Patient: Michelle Valenzuela  Procedure(s) Performed: LAPAROSCOPIC CHOLECYSTECTOMY (Abdomen)  Patient Location: PACU  Anesthesia Type:General  Level of Consciousness: awake, alert  and patient cooperative  Airway & Oxygen Therapy: Patient Spontanous Breathing and Patient connected to nasal cannula oxygen  Post-op Assessment: Report given to RN and Post -op Vital signs reviewed and stable  Post vital signs: Reviewed and stable  Last Vitals:  Vitals Value Taken Time  BP 137/74 06/27/21 1331  Temp 36.2 C 06/27/21 1247  Pulse 70 06/27/21 1335  Resp 12 06/27/21 1335  SpO2 98 % 06/27/21 1335  Vitals shown include unvalidated device data.  Last Pain:  Vitals:   06/27/21 1315  TempSrc:   PainSc: 0-No pain         Complications: No notable events documented.

## 2021-06-27 NOTE — Op Note (Signed)
PATIENT:  Michelle Valenzuela  70 y.o. female  PRE-OPERATIVE DIAGNOSIS:  Cholecystitis  POST-OPERATIVE DIAGNOSIS:  Cholecystitis  PROCEDURE:  Procedure(s): LAPAROSCOPIC CHOLECYSTECTOMY   SURGEON:  Bell Cai, Arta Bruce, MD   ASSISTANT: Drucie Ip, M.D.  ANESTHESIA:   local and general  Indications for procedure: [name] is a 70 y.o. female with symptoms of Abdominal pain and Nausea and vomiting consistent with gallbladder disease, Confirmed by CT.  Description of procedure: The patient was brought into the operative suite, placed supine. Anesthesia was administered with endotracheal tube. Patient was strapped in place and foot board was secured. All pressure points were offloaded by foam padding. The patient was prepped and draped in the usual sterile fashion.  A periumbilical incision was made and optical entry was used to enter the abdomen. 2 5 mm trocars were placed on in the right lateral space on in the right subcostal space. A 29mm trocar was placed in the subxiphoid space. Marcaine was infused to the subxiphoid space and lateral upper right abdomen in the transversus abdominis plane. Next the patient was placed in reverse trendelenberg. The gallbladder appearedfull of stones and acutely inflamed. Omentum was adhered to the gallbladder and was taken down with cautery/blunt dissection.  The gallbladder was retracted cephalad and lateral. The peritoneum was reflected off the infundibulum working lateral to medial. The cystic duct and cystic artery were identified and further dissection revealed a critical view. The cystic duct and cystic artery were doubly clipped and ligated.   The gallbladder was removed off the liver bed with cautery. The Gallbladder was placed in a specimen bag. The gallbladder fossa was irrigated and hemostasis was applied with cautery. The gallbladder was removed via the 36mm trocar. There was an arterial bleed on the mid gallbladder mesentery that was  ligated with hemolock clip.The fascial defect was closed with interrupted 0 vicryl suture via laparoscopic trans-fascial suture passer. Pneumoperitoneum was removed, all trocar were removed. All incisions were closed with 4-0 monocryl subcuticular stitch. The patient woke from anesthesia and was brought to PACU in stable condition. All counts were correct  Findings: acute cholecystitis  Specimen: gallbladder  Blood loss: 50 ml  Local anesthesia: 30 ml Marcaine  Complications: none  PLAN OF CARE: Admit to inpatient   PATIENT DISPOSITION:  PACU - hemodynamically stable.  [facility], [place]

## 2021-06-27 NOTE — Anesthesia Preprocedure Evaluation (Addendum)
Anesthesia Evaluation  Patient identified by MRN, date of birth, ID band Patient awake    Reviewed: Allergy & Precautions, NPO status , Patient's Chart, lab work & pertinent test results, reviewed documented beta blocker date and time   Airway Mallampati: II  TM Distance: >3 FB Neck ROM: Full    Dental  (+) Missing, Chipped, Caps, Dental Advisory Given   Pulmonary neg pulmonary ROS,    Pulmonary exam normal breath sounds clear to auscultation       Cardiovascular negative cardio ROS Normal cardiovascular exam Rhythm:Regular Rate:Normal     Neuro/Psych  Headaches, negative psych ROS   GI/Hepatic Neg liver ROS, Symptomatic cholelithiasis   Endo/Other  Hyperthyroidism Obesity  Renal/GU negative Renal ROS  negative genitourinary   Musculoskeletal negative musculoskeletal ROS (+)   Abdominal (+) + obese,  Abdomen: tender.    Peds  Hematology negative hematology ROS (+)   Anesthesia Other Findings   Reproductive/Obstetrics                            Anesthesia Physical  Anesthesia Plan  ASA: 2  Anesthesia Plan: General   Post-op Pain Management:    Induction: Intravenous  PONV Risk Score and Plan: 4 or greater and Treatment may vary due to age or medical condition and Ondansetron  Airway Management Planned: Oral ETT  Additional Equipment:   Intra-op Plan:   Post-operative Plan: Extubation in OR  Informed Consent: I have reviewed the patients History and Physical, chart, labs and discussed the procedure including the risks, benefits and alternatives for the proposed anesthesia with the patient or authorized representative who has indicated his/her understanding and acceptance.     Dental advisory given  Plan Discussed with: CRNA and Anesthesiologist  Anesthesia Plan Comments:         Anesthesia Quick Evaluation

## 2021-06-28 ENCOUNTER — Other Ambulatory Visit (HOSPITAL_COMMUNITY): Payer: Self-pay

## 2021-06-28 ENCOUNTER — Encounter (HOSPITAL_COMMUNITY): Payer: Self-pay | Admitting: General Surgery

## 2021-06-28 MED ORDER — OXYCODONE HCL 5 MG PO TABS
5.0000 mg | ORAL_TABLET | ORAL | 0 refills | Status: DC | PRN
  Filled 2021-06-28: qty 15, 2d supply, fill #0

## 2021-06-28 MED ORDER — IBUPROFEN 600 MG PO TABS
600.0000 mg | ORAL_TABLET | Freq: Three times a day (TID) | ORAL | 0 refills | Status: DC | PRN
  Filled 2021-06-28 (×2): qty 30, 10d supply, fill #0

## 2021-06-28 MED ORDER — ACETAMINOPHEN 325 MG PO TABS
650.0000 mg | ORAL_TABLET | Freq: Four times a day (QID) | ORAL | Status: DC | PRN
Start: 2021-06-28 — End: 2021-11-02

## 2021-06-28 NOTE — Discharge Summary (Signed)
Physician Discharge Summary  Michelle Valenzuela JSE:[id number] DOB: 10/02/1950 DOA: 06/25/2021  PCP: Michelle Valenzuela  Admit date: 06/25/2021 Discharge date: 06/28/2021  Recommendations for Outpatient Follow-up:   (include homehealth, outpatient follow-up instructions, specific recommendations for PCP to follow-up on, etc.)   Follow-up Information     [facility], [place] Follow up.   Specialty: General Surgery Why: Michelle Valenzuela est programando una cita de seguimiento en 3 semanas. Por favor llame para confirmar la fecha y hora de la cita. Llegue 30 minutos antes de la hora de la cita para Engineer, technical sales. Contact information: [address] [place] [address] [phone]                Discharge Diagnoses:  Active Problems:   Acute calculous cholecystitis   Surgical Procedure: lap chole  Discharge Condition: Good Disposition: Home  Diet recommendation: low fat diet   Hospital Course:  70 yo female was admitted for acute cholecystitis. HD 1 she underwent lap chole. She did well and was discharged home POD 1.  Discharge Instructions  Discharge Instructions     Call MD for:  difficulty breathing, headache or visual disturbances   Complete by: As directed    Call MD for:  persistant nausea and vomiting   Complete by: As directed    Call MD for:  redness, tenderness, or signs of infection (pain, swelling, redness, odor or green/yellow discharge around incision site)   Complete by: As directed    Call MD for:  severe uncontrolled pain   Complete by: As directed    Call MD for:  temperature >100.4   Complete by: As directed    Diet - low sodium heart healthy   Complete by: As directed    Increase activity slowly   Complete by: As directed       Allergies as of 06/28/2021   Valenzuela Known Allergies      Medication List     TAKE these medications    acetaminophen 325 MG tablet Commonly known as: TYLENOL Take 2 tablets (650 mg total) by mouth every  6 (six) hours as needed for mild pain, fever or headache (or temp > 100).   aspirin EC 81 MG tablet Take 81 mg by mouth daily.   AZO TABS PO Take 1 tablet by mouth 2 (two) times daily as needed (bladder pain).   ibuprofen 600 MG tablet Commonly known as: ADVIL Take 1 tablet (600 mg total) by mouth every 8 (eight) hours as needed for up to 30 doses.   methimazole 10 MG tablet Commonly known as: TAPAZOLE Take 1 tablet (10 mg total) by mouth 2 (two) times daily. Additional refills from Endocrinology What changed: additional instructions   oxyCODONE 5 MG immediate release tablet Commonly known as: Oxy IR/ROXICODONE Take 1-2 tablets (5-10 mg total) by mouth every 4 (four) hours as needed for moderate pain.        Follow-up Information     [facility], [place] Follow up.   Specialty: General Surgery Why: Michelle Valenzuela est programando una cita de seguimiento en 3 semanas. Por favor llame para confirmar la fecha y hora de la cita. Llegue 30 minutos antes de la hora de la cita para Engineer, technical sales. Contact information: [address] [place] [address] [phone]                  The results of significant diagnostics from this hospitalization (including imaging, microbiology, ancillary and laboratory) are listed below for  reference.    Significant Diagnostic Studies: CT ABDOMEN PELVIS W CONTRAST  Result Date: 06/25/2021 CLINICAL DATA:  Abdominal pain, acute, nonlocalized EXAM: CT ABDOMEN AND PELVIS WITH CONTRAST TECHNIQUE: Multidetector CT imaging of the abdomen and pelvis was performed using the standard protocol following bolus administration of intravenous contrast. CONTRAST:  178mL OMNIPAQUE IOHEXOL 300 MG/ML  SOLN COMPARISON:  April 05, 2018. FINDINGS: Lower chest: Valenzuela acute abnormality. Hepatobiliary: Cyst of the RIGHT hepatic dome. Additional subcentimeter hypodense lesions are too small to accurately characterize. There is gallbladder wall thickening  and mucosal enhancement. Gallbladder wall thickness measures approximately 5-6 mm. This is new since prior. Multiple cholelithiasis. There is possible subtle adjacent fat stranding. Valenzuela extrahepatic or intrahepatic biliary ductal dilation. Pancreas: Unremarkable. Valenzuela pancreatic ductal dilatation or surrounding inflammatory changes. Spleen: Normal in size without focal abnormality. Adrenals/Urinary Tract: Adrenal glands are unremarkable. Kidneys enhance symmetrically. Valenzuela hydronephrosis. Multifocal RIGHT-sided cortical scarring. Subcentimeter hypodense lesions are too small to accurately characterize. Valenzuela obstructive nephrolithiasis. Bladder is distended. Stomach/Bowel: Tiny hiatal hernia. Valenzuela evidence of bowel obstruction. Appendix is normal. Scattered diverticulosis without evidence of acute diverticulitis. Vascular/Lymphatic: Atherosclerotic calcifications. Valenzuela suspicious lymphadenopathy. Reproductive: Uterus and bilateral adnexa are unremarkable. Other: Valenzuela free air or free fluid. Musculoskeletal: Valenzuela acute or significant osseous findings. IMPRESSION: 1. Constellation of findings are favored to reflect chronic cholecystitis with a superimposed acute component. Recommend correlation with physical exam. Aortic Atherosclerosis (ICD10-I70.0). Electronically Signed   By: Valentino Saxon M.D.   On: 06/25/2021 12:55   US RENAL  Result Date: 06/25/2021 CLINICAL DATA:  Dysuria EXAM: RENAL / URINARY TRACT ULTRASOUND COMPLETE COMPARISON:  Abdomen pelvis 06/25/2021 FINDINGS: Right Kidney: Renal measurements: 9.8 x 5.2 x 4.2 cm = volume: 113 mL. Echogenicity within normal limits. Valenzuela mass or hydronephrosis visualized. Left Kidney: Renal measurements: 9.9 x 4.9 x 4 cm = volume: 101 mL. Echogenicity within normal limits. Valenzuela mass or hydronephrosis visualized. Bladder: Appears normal for degree of bladder distention. Other: None. IMPRESSION: Unremarkable renal ultrasound. Electronically Signed   By: Iven Finn M.D.   On:  06/25/2021 15:35   US Abdomen Limited RUQ (LIVER/GB)  Result Date: 06/25/2021 CLINICAL DATA:  Abdominal pain. Follow-up from current abdomen and pelvis CT. EXAM: ULTRASOUND ABDOMEN LIMITED RIGHT UPPER QUADRANT COMPARISON:  Current abdomen and pelvis CT. FINDINGS: Gallbladder: Multiple gallstones. Gallbladder wall measuring up to 6 mm in thickness. Trace amount of pericholecystic fluid. Positive sonographic Murphy sign Common bile duct: Diameter: 4 mm Liver: Mild increased parenchymal echogenicity. Valenzuela masses. Portal vein is patent on color Doppler imaging with normal direction of blood flow towards the liver. Other: None. IMPRESSION: 1. Findings support acute cholecystitis with gallbladder wall thickening, multiple stones, trace pericholecystic fluid and a positive sonographic Murphy's sign. Electronically Signed   By: Lajean Manes M.D.   On: 06/25/2021 15:25    Labs: Basic Metabolic Panel: Recent Labs  Lab 06/25/21 0623 06/25/21 1932 06/26/21 0717  NA 136  --  138  K 4.1  --  3.7  CL 107  --  107  CO2 22  --  24  GLUCOSE 110*  --  135*  BUN 13  --  11  CREATININE 0.74 0.76 0.75  CALCIUM 9.4  --  8.9   Liver Function Tests: Recent Labs  Lab 06/25/21 0623 06/26/21 0717  AST 18 16  ALT 16 15  ALKPHOS 86 85  BILITOT 1.0 0.8  PROT 6.8 6.1*  ALBUMIN 3.7 3.3*    CBC: Recent Labs  Lab 06/25/21 [id number]  06/25/21 1932 06/26/21 0717  WBC 10.0 8.4 6.7  HGB 13.8 13.9 13.6  HCT 42.1 40.9 41.4  MCV 90.3 89.3 92.0  PLT 256 249 237    CBG: Valenzuela results for input(s): GLUCAP in the last 168 hours.  Active Problems:   Acute calculous cholecystitis   Time coordinating discharge: 15 min

## 2021-06-28 NOTE — Progress Notes (Signed)
RN gave pt discharge instructions with the the help of stratus live interpretor. Interpretor's name was Laurell Josephs ID [phone], pt stated understanding IV has been removed, belongings packed and pt is not in pain. New medication dropped off by [facility] pharmacy.

## 2021-06-29 LAB — SURGICAL PATHOLOGY

## 2021-07-11 ENCOUNTER — Ambulatory Visit
Admission: RE | Admit: 2021-07-11 | Discharge: 2021-07-11 | Disposition: A | Discharge status: Home / Self Care | Payer: No Typology Code available for payment source | Source: Ambulatory Visit | Attending: Obstetrics and Gynecology | Admitting: Obstetrics and Gynecology

## 2021-07-11 ENCOUNTER — Other Ambulatory Visit: Payer: Self-pay

## 2021-07-11 ENCOUNTER — Ambulatory Visit
Admission: RE | Admit: 2021-07-11 | Discharge: 2021-07-11 | Disposition: A | Discharge status: Home / Self Care | Payer: 59 | Source: Ambulatory Visit | Attending: Obstetrics and Gynecology | Admitting: Obstetrics and Gynecology

## 2021-07-11 ENCOUNTER — Other Ambulatory Visit: Payer: Self-pay | Admitting: Obstetrics and Gynecology

## 2021-07-11 ENCOUNTER — Ambulatory Visit: Payer: 59 | Admitting: *Deleted

## 2021-07-11 VITALS — BP 128/82 | Wt 163.3 lb

## 2021-07-11 DIAGNOSIS — N644 Mastodynia: Secondary | ICD-10-CM

## 2021-07-11 DIAGNOSIS — Z1211 Encounter for screening for malignant neoplasm of colon: Secondary | ICD-10-CM

## 2021-07-11 DIAGNOSIS — N6310 Unspecified lump in the right breast, unspecified quadrant: Secondary | ICD-10-CM

## 2021-07-11 DIAGNOSIS — N6311 Unspecified lump in the right breast, upper outer quadrant: Secondary | ICD-10-CM

## 2021-07-11 DIAGNOSIS — Z1239 Encounter for other screening for malignant neoplasm of breast: Secondary | ICD-10-CM

## 2021-07-11 DIAGNOSIS — Z01419 Encounter for gynecological examination (general) (routine) without abnormal findings: Secondary | ICD-10-CM

## 2021-07-11 NOTE — Addendum Note (Signed)
Addended by: Demetrius Revel on: 07/11/2021 11:54 AM   Modules accepted: Orders

## 2021-07-11 NOTE — Progress Notes (Signed)
[name] is a 70 y.o. G4P0 female who presents to [facility] clinic today with complaint of left outer breast pain x 4 months. Patient states the pain comes and goes. Patient rates the pain at a 8 out of 10.    Pap Smear: Pap smear completed today. Last Pap smear was 01/11/2020 at [facility] clinic and was normal with positive HPV 18/45. Per patient has no history of an abnormal Pap smear prior to her most recent Pap smear. Last Pap smear result is available in Epic.   Physical exam: Breasts Breasts symmetrical. No skin abnormalities bilateral breasts. No nipple retraction bilateral breasts. No nipple discharge bilateral breasts. No lymphadenopathy. No lumps palpated bilateral breasts. Complaints of left breast diffuse breast pain on exam that is greater within the outer breast.  MM DIAG BREAST TOMO BILATERAL  Result Date: 06/17/2018 CLINICAL DATA:  70 year old female presenting for evaluation of intermittent shooting pain in the retroareolar left breast for approximately 2 years. EXAM: DIGITAL DIAGNOSTIC BILATERAL MAMMOGRAM WITH CAD AND TOMO ULTRASOUND LEFT BREAST COMPARISON:  None. ACR Breast Density Category b: There are scattered areas of fibroglandular density. FINDINGS: In the inferior retroareolar left breast there is an oval mass measuring approximately 6 mm. No other suspicious calcifications, masses or areas of distortion are seen in the bilateral breasts. Mammographic images were processed with CAD. Ultrasound of the left breast at 6 o'clock, 1 cm from the nipple demonstrates an irregular hypoechoic mass measuring 5 x 3 x 3 mm. Normal-appearing left axillary lymph nodes are identified. IMPRESSION: 1.  There is an indeterminate mass in the left breast at 6 o'clock. 2.  No evidence of left axillary lymphadenopathy. 3.  No mammographic evidence of right breast malignancy. RECOMMENDATION: Ultrasound guided biopsy is recommended for the left breast mass. This has been scheduled for  06/20/2018 at 1:45 p.m. I have discussed the findings and recommendations with the patient. Results were also provided in writing at the conclusion of the visit. If applicable, a reminder letter will be sent to the patient regarding the next appointment. BI-RADS CATEGORY  4: Suspicious. Electronically Signed   By: Ammie Ferrier M.D.   On: 06/17/2018 11:52   MM CLIP PLACEMENT LEFT  Result Date: 06/20/2018 CLINICAL DATA:  Post biopsy mammogram of the left breast for clip placement. EXAM: DIAGNOSTIC LEFT MAMMOGRAM POST ULTRASOUND BIOPSY COMPARISON:  Previous exam(s). FINDINGS: Mammographic images were obtained following ultrasound guided biopsy of left breast mass at 6 o'clock. The ribbon shaped biopsy marking clip is slightly inferior to the biopsied mass in the left breast at 6 o'clock. IMPRESSION: Slight inferior displacement of the ribbon shaped biopsy marking clip from the biopsied mass at 6 o'clock. Final Assessment: Post Procedure Mammograms for Marker Placement Electronically Signed   By: Ammie Ferrier M.D.   On: 06/20/2018 14:55        Pelvic/Bimanual Ext Genitalia No lesions, no swelling and no discharge observed on external genitalia.        Vagina Vagina pink and normal texture. No lesions or discharge observed in vagina.        Cervix Cervix is present. Cervix pink and of normal texture. No discharge observed.    Uterus Uterus is present and palpable. Uterus in normal position and normal size.        Adnexae Bilateral ovaries present and palpable. No tenderness on palpation.         Rectovaginal No rectal exam completed today since patient had no rectal complaints. No skin abnormalities  observed on exam.     Smoking History: Patient has never smoked.   Patient Navigation: Patient education provided. Access to services provided for patient through [place] program. Spanish interpreter [name] from [facility] provided.   Colorectal Cancer Screening: Per patient has  never had colonoscopy completed. Patient completed a FIT Test 06/20/2018 that was negative. FIT Test given to patient today to complete. No complaints today.    Breast and Cervical Cancer Risk Assessment: Patient does not have family history of breast cancer, known genetic mutations, or radiation treatment to the chest before age 21. Patient does not have history of cervical dysplasia, immunocompromised, or DES exposure in-utero.  Risk Assessment     Risk Scores       07/11/2021 06/17/2018   Last edited by: Demetrius Revel, LPN [name], LPN   5-year risk: 0.9 % 0.8 %   Lifetime risk: 2.4 % 2.8 %            A: [facility] exam with pap smear Complaint of left breast pain.  P: Referred patient to the [place] for a diagnostic mammogram. Appointment scheduled Tuesday, July 11, 2021 at 1310.  Loletta Parish, RN 07/11/2021 10:56 AM

## 2021-07-11 NOTE — Patient Instructions (Signed)
Explained breast self awareness with Michelle Valenzuela. Pap smear completed today. Let her know that her next Pap smear and/or follow-up recommendations will be based on the result of today's Pap smear. Referred patient to the [facility] for a diagnostic mammogram. Appointment scheduled Tuesday, July 11, 2021 at 1310. Patient aware of appointment and will be there. Let patient know will follow up with her within the next couple weeks with results of her Pap smear by phone. Michelle Valenzuela verbalized understanding.  Vela Render, Arvil Chaco, RN 10:57 AM

## 2021-07-14 LAB — CYTOLOGY - PAP
Comment: NEGATIVE
Diagnosis: NEGATIVE
High risk HPV: NEGATIVE

## 2021-07-19 ENCOUNTER — Telehealth: Payer: Self-pay

## 2021-07-19 NOTE — Telephone Encounter (Signed)
Via Lavon Paganini, Spanish Interpreter [facility]), Patient informed negative Pap/HPV results,no longer needs pap smears. Patient verbalized understanding, very pleased with results.

## 2021-07-28 LAB — FECAL OCCULT BLOOD, IMMUNOCHEMICAL: Fecal Occult Bld: NEGATIVE

## 2021-08-02 ENCOUNTER — Telehealth: Payer: Self-pay

## 2021-08-02 NOTE — Telephone Encounter (Signed)
(  08/01/2021) Via, Lavon Paganini, Spanish Interpreter [facility]), Patient informed negative FIT test results. Patient verbalized understanding.

## 2021-11-02 ENCOUNTER — Encounter: Payer: Self-pay | Admitting: Nurse Practitioner

## 2021-11-02 ENCOUNTER — Ambulatory Visit (INDEPENDENT_AMBULATORY_CARE_PROVIDER_SITE_OTHER): Payer: PPO | Admitting: Nurse Practitioner

## 2021-11-02 ENCOUNTER — Other Ambulatory Visit: Payer: Self-pay

## 2021-11-02 VITALS — BP 136/86 | HR 90 | Temp 96.8°F | Ht 60.0 in | Wt 162.4 lb

## 2021-11-02 DIAGNOSIS — R519 Headache, unspecified: Secondary | ICD-10-CM

## 2021-11-02 DIAGNOSIS — R739 Hyperglycemia, unspecified: Secondary | ICD-10-CM

## 2021-11-02 DIAGNOSIS — K219 Gastro-esophageal reflux disease without esophagitis: Secondary | ICD-10-CM

## 2021-11-02 DIAGNOSIS — E0590 Thyrotoxicosis, unspecified without thyrotoxic crisis or storm: Secondary | ICD-10-CM

## 2021-11-02 DIAGNOSIS — Z1211 Encounter for screening for malignant neoplasm of colon: Secondary | ICD-10-CM

## 2021-11-02 DIAGNOSIS — E781 Pure hyperglyceridemia: Secondary | ICD-10-CM

## 2021-11-02 DIAGNOSIS — R300 Dysuria: Secondary | ICD-10-CM | POA: Diagnosis not present

## 2021-11-02 LAB — CBC WITH DIFFERENTIAL/PLATELET
Basophils Absolute: 0 10*3/uL (ref 0.0–0.1)
Basophils Relative: 0.3 % (ref 0.0–3.0)
Eosinophils Absolute: 0.2 10*3/uL (ref 0.0–0.7)
Eosinophils Relative: 2.7 % (ref 0.0–5.0)
HCT: 43.1 % (ref 36.0–46.0)
Hemoglobin: 14.4 g/dL (ref 12.0–15.0)
Lymphocytes Relative: 35.3 % (ref 12.0–46.0)
Lymphs Abs: 2.7 10*3/uL (ref 0.7–4.0)
MCHC: 33.4 g/dL (ref 30.0–36.0)
MCV: 89 fl (ref 78.0–100.0)
Monocytes Absolute: 0.5 10*3/uL (ref 0.1–1.0)
Monocytes Relative: 6.8 % (ref 3.0–12.0)
Neutro Abs: 4.2 10*3/uL (ref 1.4–7.7)
Neutrophils Relative %: 54.9 % (ref 43.0–77.0)
Platelets: 233 10*3/uL (ref 150.0–400.0)
RBC: 4.84 Mil/uL (ref 3.87–5.11)
RDW: 13.2 % (ref 11.5–15.5)
WBC: 7.6 10*3/uL (ref 4.0–10.5)

## 2021-11-02 LAB — TSH: TSH: 2.62 u[IU]/mL (ref 0.35–5.50)

## 2021-11-02 LAB — LIPID PANEL
Cholesterol: 158 mg/dL (ref 0–200)
HDL: 47.6 mg/dL (ref >39.00)
LDL Cholesterol: 77 mg/dL (ref 0–99)
NonHDL: 110.03
Total CHOL/HDL Ratio: 3
Triglycerides: 166 mg/dL — ABNORMAL HIGH (ref 0.0–149.0)
VLDL: 33.2 mg/dL (ref 0.0–40.0)

## 2021-11-02 LAB — POCT URINALYSIS DIPSTICK
Bilirubin, UA: NEGATIVE
Blood, UA: NEGATIVE
Glucose, UA: NEGATIVE
Ketones, UA: NEGATIVE
Nitrite, UA: NEGATIVE
Protein, UA: POSITIVE — AB
Spec Grav, UA: 1.015 (ref 1.010–1.025)
Urobilinogen, UA: 1 E.U./dL
pH, UA: 7 (ref 5.0–8.0)

## 2021-11-02 LAB — HEMOGLOBIN A1C: Hgb A1c MFr Bld: 5.6 % (ref 4.6–6.5)

## 2021-11-02 LAB — T4, FREE: Free T4: 0.71 ng/dL (ref 0.60–1.60)

## 2021-11-02 MED ORDER — CEPHALEXIN 250 MG PO CAPS: 250.0000 mg | ORAL_CAPSULE | Freq: Three times a day (TID) | ORAL | 0 refills | Status: DC

## 2021-11-02 MED ORDER — METHIMAZOLE 10 MG PO TABS: 10.0000 mg | ORAL_TABLET | Freq: Two times a day (BID) | ORAL | 0 refills | Status: DC

## 2021-11-02 MED ORDER — CEPHALEXIN 250 MG PO CAPS: 250.0000 mg | ORAL_CAPSULE | Freq: Two times a day (BID) | ORAL | 0 refills | Status: DC

## 2021-11-02 MED ORDER — EXCEDRIN MIGRAINE 250-250-65 MG PO TABS: 1.0000 | ORAL_TABLET | Freq: Three times a day (TID) | ORAL | 0 refills | Status: DC | PRN

## 2021-11-02 MED ORDER — PANTOPRAZOLE SODIUM 40 MG PO TBEC: 40.0000 mg | DELAYED_RELEASE_TABLET | Freq: Every day | ORAL | 0 refills | Status: DC

## 2021-11-02 NOTE — Assessment & Plan Note (Addendum)
Unable to continue to Dr. Burney Gauze due to change in insurance. No palpitation, no change in appetite or GI function Wt Readings from Last 3 Encounters:  11/02/21 162 lb 6.4 oz (73.7 kg)  07/11/21 163 lb 4.8 oz (74.1 kg)  06/27/21 165 lb (74.8 kg)   BP Readings from Last 3 Encounters:  11/02/21 136/86  07/11/21 128/82  06/28/21 101/65   Check TSh and T4: normal Decrease methimazole to 10mg  daily Entered referral to endocrinology Methimazole refill sent

## 2021-11-02 NOTE — Patient Instructions (Signed)
Go to lab for blood draw You will be contacted with lab results and to schedule appt with gi and endocrinology.  Opciones de alimentos para pacientes adultos con enfermedad de reflujo gastroesofgico Food Choices for Gastroesophageal Reflux Disease, Adult Si tiene enfermedad de reflujo gastroesofgico (ERGE), los alimentos que consume y los hbitos de alimentacin son muy importantes. Elegir los alimentos adecuados puede ayudar a [employer]. Piense en consultar a un experto en alimentacin (nutricionista) para que lo ayude a Warehouse manager. Consejos para seguir Photographer las etiquetas de los alimentos Elija alimentos que tengan bajo contenido de grasas saturadas. Los alimentos que pueden ayudar con los sntomas incluyen los siguientes: Alimentos que tienen menos del 5 % de los valores diarios (VD) de grasa. Alimentos que tienen 0 gramos de grasas trans. Al cocinar No frer los alimentos. Cocinar la comida al horno, al vapor, a la plancha o en la parrilla. Estos son mtodos que no necesitan mucha grasa para Audiological scientist. Para agregar sabor, trate de consumir hierbas con bajo contenido de picante y [place]. Planificacin de las comidas  Elegir alimentos saludables con bajo contenido de [place], por ejemplo: Frutas y vegetales. Cereales integrales. Productos lcteos con bajo contenido de [place]. Lysbeth Galas, pescado y aves. Haga comidas pequeas frecuentes en lugar de 3 comidas abundantes al da. Coma lentamente, en un lugar donde est relajado. Evite agacharse o recostarse hasta 2 o 3 horas despus de haber comido. Limite los alimentos con alto contenido graso como las carnes grasas o los alimentos fritos. Limite su ingesta de alimentos grasos, como aceites, [place] y [place]. Evite lo siguiente si el mdico se lo indica: Consumir alimentos que le ocasionen sntomas. Pueden ser distintos para cada persona. Lleve un registro de los alimentos para identificar aquellos que le  causen sntomas. Alcohol. Beber mucha cantidad de lquido con las comidas. Comer 2 o 3 horas antes de acostarse. Estilo de vida Mantenga un peso saludable. Pregntele a su mdico cul es el peso saludable para usted. Si necesita perder peso, hable con su mdico para hacerlo de manera segura. Haga actividad fsica durante, al menos, 30 minutos 5 das por semana o ms, o como se lo haya indicado el mdico. Use ropa suelta. No fume ni consuma ningn producto que contenga nicotina o tabaco. Si necesita ayuda para dejar de fumar, consulte al mdico. Duerma con la cabecera de la cama ms elevada que los pies. Use una cua debajo del colchn o bloques debajo del armazn de la cama para Theatre manager la cabecera de la cama elevada. Mastique chicle sin azcar despus de las comidas. Qu alimentos debo comer? Siga una dieta saludable y bien equilibrada que incluya abundantes frutas, verduras, cereales integrales, productos lcteos descremados, carnes magras, pescado y aves. Cada persona es diferente. Los alimentos que pueden causar sntomas en una persona pueden no causar sntomas en otra persona. Trabaje con el mdico para hallar alimentos que sean seguros para usted. Es posible que los productos que se enumeran ms [place] no constituyan una lista completa de lo que puede comer y Electronics engineer. Pngase en contacto con un experto en alimentos para conocer ms opciones. Qu alimentos debo evitar? Limitar algunos de estos alimentos puede ayudar a Chief Technology Officer los sntomas de [place]. Cada persona es diferente. Consulte a un experto en alimentacin o a su mdico para que lo ayude a Pension scheme manager los alimentos exactos que debe evitar, si los hubiere. Frutas Cualquier fruta que est preparada con grasa agregada. Cualquier fruta que le ocasione sntomas. Para algunas  personas, estas pueden incluir, las frutas ctricas como naranja, pomelo, pia y limn. Verduras Verduras fritas en abundante aceite. Papas fritas. Cualquier verdura  que est preparada con grasa agregada. Cualquier verdura que le ocasione sntomas. Para algunas personas, estas pueden incluir tomates y productos con tomate, [place], cebollas y [place], y rbanos picantes. Granos Pasteles o panes sin levadura con grasa agregada. Carnes y otras protenas Carnes de alto contenido graso como carne grasa de vaca o cerdo, salchichas, costillas, jamn, salchicha, salame y tocino. Carnes o protenas fritas, lo que incluye pescado frito y pollo frito. Frutos secos y Engineer, mining de frutos secos, en grandes cantidades. Lcteos Leche entera y [place] con chocolate. Phoebe Sharps. Crema. Helado. Queso crema. Batidos con [employer]. Grasas y [employer]. Margarina. Lardo. [place] clarificada. Bebidas Caf y t negro, con o sin cafena. Bebidas con gas. Refrescos. Bebidas energizantes. Jugo de fruta hecho con frutas cidas, como naranja o pomelo. Jugo de tomate. Bebidas alcohlicas. Dulces y postres Chocolate y cacao. [place]. Alios y condimentos Pimienta. Menta y mentol. Sal agregada. Cualquier condimento, hierbas o aderezos que le ocasionen sntomas. Para algunas personas, esto puede incluir curry, salsa picante o aderezos para ensalada a base de vinagre. Es posible que los productos que se enumeran ms arriba no constituyan una lista completa de lo que no debera comer y Electronics engineer. Pngase en contacto con un experto en alimentos para conocer ms opciones. Preguntas para hacerle al [employer] Los cambios en la dieta y en el estilo de vida a menudo son los primeros pasos que se toman para Air cabin crew los sntomas de [place]. Si los [employer] dieta y el estilo de vida no ayudan, hable con el mdico sobre el uso de medicamentos. Dnde buscar ms informacin Control and instrumentation engineer for Gastrointestinal Disorders (Fundacin Internacional para los Trastornos Gastrointestinales): aboutgerd.org Resumen Si tiene [employer], las elecciones de alimentos y el [place] de vida son muy importantes  para ayudar a Herbalist sntomas. Haga comidas pequeas durante Psychiatrist de 3 comidas abundantes. Coma lentamente y en un lugar donde est relajado. Evite agacharse o recostarse hasta 2 o 3 horas despus de haber comido. Limite los alimentos con alto contenido graso como las carnes grasas o los alimentos fritos. Esta informacin no tiene Marine scientist el consejo del mdico. Asegrese de hacerle al mdico cualquier pregunta que tenga. Document Revised: 04/29/2020 Document Reviewed: 04/29/2020 Elsevier Patient Education  [place].

## 2021-11-02 NOTE — Progress Notes (Signed)
Subjective:  Patient ID: Michelle Valenzuela, female    DOB: [birth year]  Age: 71 y.o. MRN: [id number]  CC: Acute Visit (Pt c/o pain and dizziness when urinating x 1 month. ) Accompanied by spanish interpreter  Dysuria  This is a recurrent problem. The current episode started more than 1 month ago. The problem occurs intermittently. The problem has been waxing and waning. The quality of the pain is described as burning. The pain is moderate. There has been no fever. She is Not sexually active. There is No history of pyelonephritis. Pertinent negatives include no chills, discharge, flank pain, frequency, hematuria, hesitancy, nausea, sweats, urgency or vomiting. She has tried home medications for the symptoms. The treatment provided no relief. There is no history of catheterization, kidney stones, recurrent UTIs, a single kidney, urinary stasis or a urological procedure.  Gastroesophageal Reflux She complains of belching, globus sensation and heartburn. She reports no abdominal pain, no chest pain, no choking, no coughing, no dysphagia, no early satiety, no hoarse voice, no nausea, no sore throat, no stridor, no tooth decay, no water brash or no wheezing. This is a chronic problem. The current episode started more than 1 year ago. The problem occurs frequently. The problem has been unchanged. The heartburn is located in the substernum and abdomen. The heartburn is of moderate intensity. The heartburn does not wake her from sleep. The heartburn does not limit her activity. The heartburn doesn't change with position. The symptoms are aggravated by certain foods and lying down. Pertinent negatives include no anemia, fatigue, melena, muscle weakness, orthopnea or weight loss. Risk factors include lack of exercise and obesity. She has tried a PPI (prvacid 15mg  OTC) for the symptoms. The treatment provided mild relief. Past procedures do not include an EGD, H. pylori antibody titer or a UGI.   She Reports  hx of prolapse uterus   Hyperthyroidism Unable to continue to Dr. Burney Gauze due to change in insurance. No palpitation, no change in appetite or GI function Wt Readings from Last 3 Encounters:  11/02/21 162 lb 6.4 oz (73.7 kg)  07/11/21 163 lb 4.8 oz (74.1 kg)  06/27/21 165 lb (74.8 kg)   BP Readings from Last 3 Encounters:  11/02/21 136/86  07/11/21 128/82  06/28/21 101/65   Check TSh and T4: normal Decrease methimazole to 10mg  daily Entered referral to endocrinology Methimazole refill sent  Hypertriglyceridemia Repeat lipid panel: Lipid Panel     Component Value Date/Time   CHOL 158 11/02/2021 1226   TRIG 166.0 (H) 11/02/2021 1226   HDL 47.60 11/02/2021 1226   CHOLHDL 3 11/02/2021 1226   VLDL 33.2 11/02/2021 1226   LDLCALC 77 11/02/2021 1226   Maintain heart healthy diet and regular exercise  Nonintractable episodic headache No change, associated with dizziness. Triggered by high sugary foods.  Advised to avoid trigger. Use excedrin prn  Hyperglycemia Repeat hgbA1c: normal at 5.6%  Reviewed past Medical, Social and Family history today.  Outpatient Medications Prior to Visit  Medication Sig Dispense Refill   aspirin EC 81 MG tablet Take 81 mg by mouth daily.     methimazole (TAPAZOLE) 10 MG tablet Take 1 tablet (10 mg total) by mouth daily. Additional refills from Endocrinology 90 tablet 0   acetaminophen (TYLENOL) 325 MG tablet Take 2 tablets (650 mg total) by mouth every 6 (six) hours as needed for mild pain, fever or headache (or temp > 100). (Patient not taking: Reported on 11/02/2021)     ibuprofen (ADVIL) 600 MG tablet  Take 1 tablet (600 mg total) by mouth every 8 (eight) hours as needed for up to 30 doses. (Patient not taking: Reported on 11/02/2021) 30 tablet 0   methimazole (TAPAZOLE) 10 MG tablet Take 1 tablet (10 mg total) by mouth 2 (two) times daily. Additional refills from Endocrinology (Patient taking differently: Take 10 mg by mouth 2 (two) times  daily.) 60 tablet 0   oxyCODONE (OXY IR/ROXICODONE) 5 MG immediate release tablet Take 1-2 tablets (5-10 mg total) by mouth every 4 (four) hours as needed for moderate pain. (Patient not taking: Reported on 11/02/2021) 15 tablet 0   Phenazopyridine HCl (AZO TABS PO) Take 1 tablet by mouth 2 (two) times daily as needed (bladder pain). (Patient not taking: Reported on 11/02/2021)     No facility-administered medications prior to visit.    ROS See HPI  Objective:  BP 136/86 (BP Location: Left Arm, Patient Position: Sitting, Cuff Size: Normal)    Pulse 90    Temp (!) 96.8 F (36 C) (Temporal)    Ht 5' (1.524 m)    Wt 162 lb 6.4 oz (73.7 kg)    LMP  (LMP Unknown)    SpO2 98%    BMI 31.72 kg/m   Physical Exam Vitals reviewed.  Constitutional:      General: She is not in acute distress. Cardiovascular:     Rate and Rhythm: Normal rate and regular rhythm.     Pulses: Normal pulses.     Heart sounds: Normal heart sounds.  Pulmonary:     Effort: Pulmonary effort is normal.     Breath sounds: Normal breath sounds.  Abdominal:     General: There is no distension.     Tenderness: There is no abdominal tenderness.  Musculoskeletal:     Right lower leg: No edema.     Left lower leg: No edema.  Neurological:     Mental Status: She is alert and oriented to person, place, and time.  Psychiatric:        Mood and Affect: Mood normal.        Behavior: Behavior normal.    Assessment & Plan:  This visit occurred during the SARS-CoV-2 public health emergency.  Safety protocols were in place, including screening questions prior to the visit, additional usage of staff PPE, and extensive cleaning of exam room while observing appropriate contact time as indicated for disinfecting solutions.   Michelle Valenzuela was seen today for acute visit.  Diagnoses and all orders for this visit:  Dysuria -     POCT urinalysis dipstick -     Urine Culture -     Discontinue: cephALEXin (KEFLEX) 250 MG capsule; Take 1  capsule (250 mg total) by mouth 2 (two) times daily. -     cephALEXin (KEFLEX) 250 MG capsule; Take 1 capsule (250 mg total) by mouth 3 (three) times daily.  Hyperglycemia -     Hemoglobin A1c  Hyperthyroidism -     CBC with Differential/Platelet -     Ambulatory referral to Endocrinology -     Discontinue: methimazole (TAPAZOLE) 10 MG tablet; Take 1 tablet (10 mg total) by mouth 2 (two) times daily. Additional refills from Endocrinology -     TSH -     T4, free  Nonintractable episodic headache, unspecified headache type -     aspirin-acetaminophen-caffeine (EXCEDRIN MIGRAINE) 250-250-65 MG tablet; Take 1 tablet by mouth every 8 (eight) hours as needed for headache.  Hypertriglyceridemia -     Lipid  panel  Gastroesophageal reflux disease without esophagitis -     Ambulatory referral to Gastroenterology -     pantoprazole (PROTONIX) 40 MG tablet; Take 1 tablet (40 mg total) by mouth daily.  Colon cancer screening -     Ambulatory referral to Gastroenterology    Problem List Items Addressed This Visit       Digestive   Gastroesophageal reflux disease without esophagitis   Relevant Medications   pantoprazole (PROTONIX) 40 MG tablet   Other Relevant Orders   Ambulatory referral to Gastroenterology     Endocrine   Hyperthyroidism    Unable to continue to Dr. Burney Gauze due to change in insurance. No palpitation, no change in appetite or GI function Wt Readings from Last 3 Encounters:  11/02/21 162 lb 6.4 oz (73.7 kg)  07/11/21 163 lb 4.8 oz (74.1 kg)  06/27/21 165 lb (74.8 kg)   BP Readings from Last 3 Encounters:  11/02/21 136/86  07/11/21 128/82  06/28/21 101/65   Check TSh and T4: normal Decrease methimazole to 10mg  daily Entered referral to endocrinology Methimazole refill sent      Relevant Medications   methimazole (TAPAZOLE) 10 MG tablet   Other Relevant Orders   CBC with Differential/Platelet (Completed)   Ambulatory referral to Endocrinology   TSH  (Completed)   T4, free (Completed)     Other   Dysuria - Primary   Relevant Medications   cephALEXin (KEFLEX) 250 MG capsule   Other Relevant Orders   POCT urinalysis dipstick (Completed)   Urine Culture   Hyperglycemia    Repeat hgbA1c: normal at 5.6%      Relevant Orders   Hemoglobin A1c (Completed)   Hypertriglyceridemia    Repeat lipid panel: Lipid Panel     Component Value Date/Time   CHOL 158 11/02/2021 1226   TRIG 166.0 (H) 11/02/2021 1226   HDL 47.60 11/02/2021 1226   CHOLHDL 3 11/02/2021 1226   VLDL 33.2 11/02/2021 1226   LDLCALC 77 11/02/2021 1226   Maintain heart healthy diet and regular exercise      Relevant Orders   Lipid panel (Completed)   Nonintractable episodic headache    No change, associated with dizziness. Triggered by high sugary foods.  Advised to avoid trigger. Use excedrin prn      Relevant Medications   aspirin-acetaminophen-caffeine (EXCEDRIN MIGRAINE) 250-250-65 MG tablet   Other Visit Diagnoses     Colon cancer screening       Relevant Orders   Ambulatory referral to Gastroenterology       Follow-up: Return in about 3 months (around 01/30/2022) for GERD and headache.  Wilfred Lacy, NP

## 2021-11-02 NOTE — Assessment & Plan Note (Addendum)
Repeat lipid panel: Lipid Panel     Component Value Date/Time   CHOL 158 11/02/2021 1226   TRIG 166.0 (H) 11/02/2021 1226   HDL 47.60 11/02/2021 1226   CHOLHDL 3 11/02/2021 1226   VLDL 33.2 11/02/2021 1226   LDLCALC 77 11/02/2021 1226   Maintain heart healthy diet and regular exercise

## 2021-11-02 NOTE — Assessment & Plan Note (Signed)
No change, associated with dizziness. Triggered by high sugary foods.  Advised to avoid trigger. Use excedrin prn

## 2021-11-03 LAB — URINE CULTURE
MICRO NUMBER:: 12955215
SPECIMEN QUALITY:: ADEQUATE

## 2021-11-03 NOTE — Assessment & Plan Note (Signed)
Repeat hgbA1c: normal at 5.6%

## 2021-12-14 ENCOUNTER — Telehealth: Payer: Self-pay | Admitting: Nurse Practitioner

## 2021-12-14 ENCOUNTER — Ambulatory Visit (INDEPENDENT_AMBULATORY_CARE_PROVIDER_SITE_OTHER): Payer: PPO | Admitting: Pediatrics

## 2021-12-14 NOTE — Telephone Encounter (Signed)
Pt is wanting a call back concerning her most recent lab results. Please advise pt at [phone] ?

## 2021-12-14 NOTE — Telephone Encounter (Signed)
Pt is needing her referral from 11/02/21 sent to an adult endocrinologist.  ?

## 2021-12-15 NOTE — Telephone Encounter (Signed)
Interpreter from language line lvm for patient to return call. ?

## 2021-12-20 NOTE — Telephone Encounter (Signed)
Pt is calling in wanting to know the name of the  endocrinology referral she has been changed to. Please advise pt and an interpretor will be needed. Spanish speaking  ?

## 2021-12-22 NOTE — Telephone Encounter (Signed)
Pt friend called office and spoke with [place]. She was not satisfied they have not been contacted about referral. Barnett Applebaum was to transfer to me but pt friend hung up. I have LVM using interpreter services for pt to return call to the office and request to speak with me.  ?

## 2022-01-01 ENCOUNTER — Other Ambulatory Visit: Payer: Self-pay | Admitting: Nurse Practitioner

## 2022-01-01 DIAGNOSIS — E0590 Thyrotoxicosis, unspecified without thyrotoxic crisis or storm: Secondary | ICD-10-CM

## 2022-01-16 NOTE — Telephone Encounter (Signed)
Pt's friend Malachy Moan (?) returned Kristie's call. Her CB is [phone] ?

## 2022-01-16 NOTE — Telephone Encounter (Signed)
Called [phone] and got recording.  ?  ?We're sorry, your call cannot be completed at this time. ?Please hang up and try your call again later.  ?Thank you. ? ?DPR on file from 2019 is not for all [facility] but only lists pt and Delma Post.  ?

## 2022-01-17 NOTE — Telephone Encounter (Signed)
Late entry ?Spoke with Zonia at 4:47p 01/16/22 ?Correct ph# [phone] ?Apologized for [place] not having correct ph # on file and that I was unable to reach her.  ?She was with the pt and asked for the referral info on Endo and GI referrals.  ?Provided name of office, ph#, address & need to call them to schedule appts. ?

## 2022-01-18 ENCOUNTER — Encounter: Payer: Self-pay | Admitting: Gastroenterology

## 2022-01-30 ENCOUNTER — Ambulatory Visit (INDEPENDENT_AMBULATORY_CARE_PROVIDER_SITE_OTHER): Payer: PPO | Admitting: Nurse Practitioner

## 2022-01-30 ENCOUNTER — Encounter: Payer: Self-pay | Admitting: Nurse Practitioner

## 2022-01-30 VITALS — BP 126/86 | HR 58 | Temp 97.5°F | Resp 16 | Wt 166.0 lb

## 2022-01-30 DIAGNOSIS — R300 Dysuria: Secondary | ICD-10-CM

## 2022-01-30 DIAGNOSIS — K219 Gastro-esophageal reflux disease without esophagitis: Secondary | ICD-10-CM

## 2022-01-30 DIAGNOSIS — M25552 Pain in left hip: Secondary | ICD-10-CM

## 2022-01-30 DIAGNOSIS — G8929 Other chronic pain: Secondary | ICD-10-CM

## 2022-01-30 DIAGNOSIS — M25511 Pain in right shoulder: Secondary | ICD-10-CM

## 2022-01-30 DIAGNOSIS — M25551 Pain in right hip: Secondary | ICD-10-CM

## 2022-01-30 DIAGNOSIS — M25512 Pain in left shoulder: Secondary | ICD-10-CM

## 2022-01-30 LAB — POCT URINALYSIS DIPSTICK
Bilirubin, UA: NEGATIVE
Blood, UA: NEGATIVE
Glucose, UA: NEGATIVE
Ketones, UA: NEGATIVE
Nitrite, UA: NEGATIVE
Protein, UA: NEGATIVE
Spec Grav, UA: 1.015 (ref 1.010–1.025)
Urobilinogen, UA: 0.2 E.U./dL
pH, UA: 6 (ref 5.0–8.0)

## 2022-01-30 MED ORDER — PANTOPRAZOLE SODIUM 20 MG PO TBEC: 20.0000 mg | DELAYED_RELEASE_TABLET | Freq: Every day | ORAL | 1 refills | Status: DC

## 2022-01-30 MED ORDER — MELOXICAM 7.5 MG PO TABS: 7.5000 mg | ORAL_TABLET | Freq: Every day | ORAL | 5 refills | Status: DC

## 2022-01-30 NOTE — Progress Notes (Signed)
? ?             Established Patient Visit ? ?Patient: Michelle Valenzuela   DOB: 1950/11/03   71 y.o. Female  MRN: [id number] ?Visit Date: 01/31/2022 ? ?Subjective:  ?  ?Chief Complaint  ?Patient presents with  ? Acute Visit  ?  Patient would like to address possible UTI. States she still has burning sensation when urinating.  ?Patient would also like to discuss numbness and tingling in left arm and leg for several days, worse in the past day.   ? ?Declined video interpreter. ?Opted for her friend to translate. ? ?Hip Pain  ?The incident occurred more than 1 week ago. There was no injury mechanism. The pain is present in the left hip, right hip, right leg, left thigh and right thigh. The quality of the pain is described as aching and cramping. The pain is moderate. The pain has been Intermittent since onset. Pertinent negatives include no inability to bear weight, loss of motion, loss of sensation, muscle weakness, numbness or tingling. She reports no foreign bodies present. The symptoms are aggravated by movement. She has tried nothing for the symptoms.  ?Dysuria ?Chronic, waxing and waning ?Improves with OTC AZO cranberry. ?No flank pain, no ABD pain, no constipation or diarrhea, no vaginal discharge or pelvic pain. ? ?Repeat UA POCT: normal urine sent for culture ?If normal, enter urology referral. ?Maintain adequate oral hydration ? ?Wt Readings from Last 3 Encounters:  ?01/30/22 166 lb (75.3 kg)  ?11/02/21 162 lb 6.4 oz (73.7 kg)  ?07/11/21 163 lb 4.8 oz (74.1 kg)  ?  ?Reviewed medical, surgical, and social history today ? ?Medications: ?Outpatient Medications Prior to Visit  ?Medication Sig  ? methimazole (TAPAZOLE) 10 MG tablet TAKE 1 TABLET BY MOUTH TWICE DAILY, ADDITIONAL REFILLS FROM ENDOCRINOLOGY  ? [DISCONTINUED] aspirin EC 81 MG tablet Take 81 mg by mouth daily. (Patient not taking: Reported on 01/30/2022)  ? [DISCONTINUED] aspirin-acetaminophen-caffeine (EXCEDRIN MIGRAINE) 250-250-65 MG tablet Take 1  tablet by mouth every 8 (eight) hours as needed for headache. (Patient not taking: Reported on 01/30/2022)  ? [DISCONTINUED] cephALEXin (KEFLEX) 250 MG capsule Take 1 capsule (250 mg total) by mouth 3 (three) times daily. (Patient not taking: Reported on 01/30/2022)  ? [DISCONTINUED] pantoprazole (PROTONIX) 40 MG tablet Take 1 tablet (40 mg total) by mouth daily. (Patient not taking: Reported on 01/30/2022)  ? ?No facility-administered medications prior to visit.  ? ?Reviewed past medical and social history.  ? ?ROS per HPI above ? ? ?   ?Objective:  ?BP 126/86 (BP Location: Left Arm, Patient Position: Sitting, Cuff Size: Large)   Pulse (!) 58   Temp (!) 97.5 ?F (36.4 ?C) (Temporal)   Resp 16   Wt 166 lb (75.3 kg)   LMP  (LMP Unknown)   SpO2 98%   BMI 32.42 kg/m?  ? ?  ? ?Physical Exam ?Constitutional:   ?   Appearance: She is obese.  ?Cardiovascular:  ?   Rate and Rhythm: Normal rate.  ?   Pulses: Normal pulses.  ?   Heart sounds: Normal heart sounds.  ?Pulmonary:  ?   Effort: Pulmonary effort is normal.  ?Musculoskeletal:     ?   General: Tenderness present. No swelling. Normal range of motion.  ?   Right shoulder: Tenderness and crepitus present. No effusion. Normal range of motion. Normal strength.  ?   Left shoulder: Tenderness and crepitus present. No effusion. Normal range of motion. Normal strength.  ?  Right upper arm: Normal.  ?   Left upper arm: Normal.  ?   Lumbar back: Tenderness present. No bony tenderness. Normal range of motion. Negative right straight leg raise test and negative left straight leg raise test.  ?   Right hip: Tenderness present. No crepitus. Normal range of motion. Normal strength.  ?   Left hip: Tenderness present. No crepitus. Normal range of motion. Normal strength.  ?   Right upper leg: Normal.  ?   Left upper leg: Normal.  ?   Right knee: Normal.  ?   Left knee: Normal.  ?   Right lower leg: Normal. No edema.  ?   Left lower leg: Normal. No edema.  ?   Right ankle: Normal.  ?    Left ankle: Normal.  ?   Right foot: Normal.  ?   Left foot: Normal.  ?Neurological:  ?   Mental Status: She is alert and oriented to person, place, and time.  ?  ?Results for orders placed or performed in visit on 01/30/22  ?POCT Urinalysis Dipstick  ?Result Value Ref Range  ? Color, UA yellow   ? Clarity, UA clear   ? Glucose, UA Negative Negative  ? Bilirubin, UA Negative   ? Ketones, UA Negative   ? Spec Grav, UA 1.015 1.010 - 1.025  ? Blood, UA Negative   ? pH, UA 6.0 5.0 - 8.0  ? Protein, UA Negative Negative  ? Urobilinogen, UA 0.2 0.2 or 1.0 E.U./dL  ? Nitrite, UA Negative   ? Leukocytes, UA Trace (A) Negative  ? Appearance Clear   ? Odor None   ? ?   ?Assessment & Plan:  ?  ?Problem List Items Addressed This Visit   ? ?  ? Digestive  ? Gastroesophageal reflux disease without esophagitis  ? Relevant Medications  ? pantoprazole (PROTONIX) 20 MG tablet  ?  ? Other  ? Dysuria - Primary  ?  Chronic, waxing and waning ?Improves with OTC AZO cranberry. ?No flank pain, no ABD pain, no constipation or diarrhea, no vaginal discharge or pelvic pain. ? ?Repeat UA POCT: normal urine sent for culture ?If normal, enter urology referral. ?Maintain adequate oral hydration ? ?  ?  ? Relevant Orders  ? POCT Urinalysis Dipstick (Completed)  ? Urine Culture  ? ?Other Visit Diagnoses   ? ? Bilateral hip pain      ? Relevant Medications  ? meloxicam (MOBIC) 7.5 MG tablet  ? Other Relevant Orders  ? Ambulatory referral to Physical Therapy  ? Chronic pain of both shoulders      ? Relevant Medications  ? meloxicam (MOBIC) 7.5 MG tablet  ? Other Relevant Orders  ? Ambulatory referral to Physical Therapy  ? ?  ? ?Return in about 1 year (around 01/31/2023) for CPE (fasting). ? ?  ? ?Wilfred Lacy, NP ? ? ?

## 2022-01-30 NOTE — Patient Instructions (Signed)
Start multivitamin (centrum +50) 1 tab daily ?Take meloxicam 7.'5mg'$  once a day with food. For joint pain. ?You will be contacted to schedule appt for physical therapy. ?Stop aspirin. ?Your urine is sent for culture. If no bacteria growth, will refer you to urology. ?Maintain upcoming appt with endocrinology. ? ?Artrosis ?Osteoarthritis ? ?La artrosis es un tipo de artritis. Esta afecci?n produce dolor o enfermedad en las articulaciones. La artrosis afecta al tejido que cubre los extremos de los [employer] articulaciones (cart?lago). El cart?lago act?a como amortiguador entre los [place] y los ayuda a moverse con suavidad. La artrosis se presenta cuando el cart?lago de las articulaciones se gasta. A veces, la artrosis se denomina artritis ?por uso y desgaste?Marland Kitchen ?La artrosis es la forma m?s frecuente de artritis. A menudo, afecta a las [employer]. Es una afecci?n que empeora con el [place]. Las articulaciones afectadas con mayor frecuencia por esta afecci?n se encuentran en los dedos de Mar[employer]ott, los dedos de [employer]on, las caderas, las rodillas y la columna vertebral, incluyendo el cuello y la parte inferior de la espalda. ??Cu?les son las causas? ?Esta afecci?n es causada por el desgaste del cart?lago que cubre los extremos de [place]. ??Qu? incrementa el riesgo? ?Los siguientes factores pueden hacer que sea m?s propenso a Armed forces training and education officer afecci?n: ?Ser mayor de 50 a?os. ?Obesidad. ?Uso excesivo de la articulaci?n. ?Lesi?n pasada de una articulaci?n. ?Cirug?a pasada en una articulaci?n. ?Antecedentes familiares de artrosis. ??Cu?les son los signos o s?ntomas? ?Los principales s?ntomas de esta enfermedad son dolor, hinchaz?n y rigidez en la articulaci?n. Otros s?ntomas pueden incluir: ?Agrandamiento de la articulaci?n. ?Aumento del dolor y da?o adicional causado por peque?os trozos de [employer] o cart?lago que se desprenden y flotan dentro de la articulaci?n. ?Formaci?n de peque?os dep?sitos de hueso  (osteofitos) en los extremos de la articulaci?n. ?Una sensaci?n de chirrido o raspado dentro de la articulaci?n al [employer]. ?Sonidos de chasquido o crujido al [employer]. ?Dificultad para caminar o hacer ejercicio. ?Incapacidad para agarrar objetos, girar las manos o [place] [employer] movimientos de las manos y los dedos. ??C?mo se diagnostica? ?Esta afecci?n se puede diagnosticar en funci?n de lo siguiente: ?Sus antecedentes m?dicos. ?Un examen f?sico. ?Sus s?ntomas. ?Radiograf?as de la(s) articulaci?n(es) afectada(s). ?An?lisis de sangre para descartar otros tipos de artritis. ??C?mo se trata? ?No hay cura para esta enfermedad, pero el tratamiento puede ayudar a Financial controller y Teacher, English as a foreign language el funcionamiento de la articulaci?n. El tratamiento puede incluir una combinaci?n de terapias, [place] las siguientes: ?T?cnicas de [employer], como: ?Aplicaci?n de calor y fr?o en la articulaci?n. ?Masajes. ?Una forma de psicoterapia llamada terapia cognitivo conductual (TCC). Esta terapia le ayuda a Secretary/administrator y a Optometrist un seguimiento de los cambios que hace. ?Analg?sicos y antiinflamatorios. Los medicamentos pueden tomarse por boca o Kellogg. Incluyen los siguientes: ?Antiinflamatorios no esteroideos (AINE), como el ibuprofeno. ?Medicamentos recetados. ?Antiinflamatorios fuertes (corticoesteroides). ?Ciertos suplementos nutricionales. ?Un programa de ejercicios recomendado. Puede trabajar con un fisioterapeuta. ?Dispositivos de [place], como un dispositivo ortop?dico, una f?rula, un guante especial o un bast?n. ?Un plan de control del peso. ?Cirug?a, como: ?Una osteotom?a. Se hace para volver a posicionar los [employer] Services y Best boy o para Charity fundraiser los trozos sueltos de hueso y cart?lago. ?Cirug?a de reemplazo articular. Es posible que necesite esta cirug?a si tiene una artrosis [place]. ?Siga estas instrucciones en su casa: ?Actividad ?Descanse las articulaciones afectadas como se lo haya indicado  el m?dico. ?Haga actividad f?sica como se lo haya  indicado el m?dico. Este puede recomendarle tipos espec?ficos de ejercicios, por ejemplo: ?Ejercicios de fortalecimiento. Se realizan para fortalecer los m?sculos que sostienen las articulaciones afectadas por la artritis. ?Ejercicios aer?bicos. Son ejercicios, como caminar a paso ligero o hacer gimnasia aer?bica acu?tica, que aumentan la frecuencia card?aca. ?Actividades de amplitud de movimientos. Estos ayudan a que las articulaciones se muevan con m?s facilidad. ?Ejercicios de equilibrio y [place]. ?Control del dolor, la rigidez y la hinchaz?n ? ?  ? ?Si se lo indican, aplique calor en la zona afectada con la frecuencia que le haya indicado el m?dico. Use la fuente de calor que el m?dico le recomiende, como una compresa de calor h?medo o una almohadilla t?rmica. ?Si tiene un dispositivo de ayuda que se puede quitar, qu?teselo seg?n lo indicado por su m?dico. ?Col?quese una toalla entre la piel y la fuente de Freight forwarder. Si el m?dico le indica que no se quite el dispositivo de H[employer] Inc se Passenger transport manager, coloque una toalla entre el dispositivo de [place] y la fuente de Freight forwarder. ?Aplique calor durante 20 a 30 minutos. ?Retire la fuente de calor si la piel se pone de color rojo brillante. Esto es especialmente importante si no puede sentir dolor, calor o fr?o. Puede correr un riesgo mayor de sufrir quemaduras. ?Si se lo indican, aplique hielo sobre la zona afectada. Para hacer esto: ?Si tiene un dispositivo de ayuda que se puede quitar, qu?teselo seg?n lo indicado por su m?dico. ?Ponga el hielo en una bolsa pl?stica. ?Coloque una [employer] piel y Therapist, nutritional. Si el m?dico le indica que no se quite el dispositivo de H[employer] Inc se aplica hielo, coloque una toalla entre el dispositivo de [place] y la bolsa de hielo. ?Aplique el hielo durante 20 minutos, 2 o 3 veces por d?a. ?Mueva los dedos de las manos o de los pies con frecuencia para reducir la rigidez y la  hinchaz?n. ?Cuando est? sentado o acostado, levante (eleve) la zona lesionada por encima del nivel del coraz?n. ?Instrucciones generales ?Use los medicamentos de venta libre y los recetados solamente como se lo haya indicado el m?dico. ?Mantenga un peso saludable. Siga las instrucciones de su m?dico con respecto al control de su peso. ?No consuma ning?n producto que contenga nicotina o tabaco, como cigarrillos, cigarrillos electr?nicos y tabaco de Higher education careers adviser. Si necesita ayuda para dejar de fumar, consulte al m?dico. ?Use los dispositivos de ayuda como se lo haya indicado el m?dico. ?Concurra a todas las visitas de seguimiento como se lo haya indicado el m?dico. Esto es importante. ?D?nde buscar m?s informaci?n ?[employer] of Arthritis and Musculoskeletal and Skin Diseases ([place] Artritis y [place] Musculoesquel?ticas y Dermatol?gicas): www.niams.SouthExposed.es ?[employer] on Aging ([place] Envejecimiento): http://kim-miller.com/ ?[employer] of Rheumatology (Instituto Norteamericano de Reumatolog?a): www.rheumatology.org ?Comun?quese con un m?dico si: ?Tiene enrojecimiento, hinchaz?n o sensaci?n de calor que empeora en una articulaci?n. ?Tiene fiebre y siente dolor en la articulaci?n o el m?sculo. ?Presenta una erupci?n cut?nea. ?Tiene dificultad para [employer] Corporation cotidianas. ?Solicite ayuda de inmediato si: ?Siente dolor que empeora y no se alivia con los analg?sicos. ?Resumen ?La artrosis es una clase de artritis que afecta el tejido que cubre los extremos de los huesos en las articulaciones (cart?lago). ?Esta afecci?n es causada por el desgaste del cart?lago que cubre los extremos de [place]. ?Los s?ntomas principales de esta enfermedad son dolor, hinchaz?n y rigidez en la articulaci?n. ?No hay cura para esta enfermedad, pero el tratamiento puede ayudar a Financial controller  y mejorar el funcionamiento de la articulaci?n. ?Esta informaci?n no tiene Buyer, retail el consejo del m?dico. Aseg?rese de hacerle al m?dico cualquier pregunta que tenga. ?Document Revised: 10/26/2019 Document Reviewed: 10/26/2019 ?Elsevier Patient Education ? [place]. ? ? ?

## 2022-01-30 NOTE — Assessment & Plan Note (Signed)
Chronic, waxing and waning ?Improves with OTC AZO cranberry. ?No flank pain, no ABD pain, no constipation or diarrhea, no vaginal discharge or pelvic pain. ? ?Repeat UA POCT: normal urine sent for culture ?If normal, enter urology referral. ?Maintain adequate oral hydration ?

## 2022-01-31 ENCOUNTER — Other Ambulatory Visit: Payer: Self-pay | Admitting: Nurse Practitioner

## 2022-01-31 ENCOUNTER — Encounter: Payer: Self-pay | Admitting: Nurse Practitioner

## 2022-01-31 DIAGNOSIS — K219 Gastro-esophageal reflux disease without esophagitis: Secondary | ICD-10-CM

## 2022-01-31 DIAGNOSIS — M25551 Pain in right hip: Secondary | ICD-10-CM | POA: Insufficient documentation

## 2022-01-31 DIAGNOSIS — G8929 Other chronic pain: Secondary | ICD-10-CM | POA: Insufficient documentation

## 2022-01-31 LAB — URINE CULTURE
MICRO NUMBER:: 13340030
Result:: NO GROWTH
SPECIMEN QUALITY:: ADEQUATE

## 2022-01-31 NOTE — Telephone Encounter (Signed)
Rx filled by PCP on 01/30/22 ?

## 2022-02-01 NOTE — Addendum Note (Signed)
Addended by: Wilfred Lacy L on: 02/01/2022 12:11 PM ? ? Modules accepted: Orders ? ?

## 2022-02-12 ENCOUNTER — Ambulatory Visit (INDEPENDENT_AMBULATORY_CARE_PROVIDER_SITE_OTHER): Payer: PPO | Admitting: Gastroenterology

## 2022-02-12 ENCOUNTER — Encounter: Payer: Self-pay | Admitting: Gastroenterology

## 2022-02-12 VITALS — BP 130/80 | HR 77 | Ht 60.0 in | Wt 168.2 lb

## 2022-02-12 DIAGNOSIS — R0989 Other specified symptoms and signs involving the circulatory and respiratory systems: Secondary | ICD-10-CM

## 2022-02-12 DIAGNOSIS — K219 Gastro-esophageal reflux disease without esophagitis: Secondary | ICD-10-CM

## 2022-02-12 DIAGNOSIS — R142 Eructation: Secondary | ICD-10-CM

## 2022-02-12 DIAGNOSIS — R12 Heartburn: Secondary | ICD-10-CM | POA: Diagnosis not present

## 2022-02-12 DIAGNOSIS — R109 Unspecified abdominal pain: Secondary | ICD-10-CM

## 2022-02-12 MED ORDER — PANTOPRAZOLE SODIUM 40 MG PO TBEC: 40.0000 mg | DELAYED_RELEASE_TABLET | Freq: Two times a day (BID) | ORAL | 3 refills | Status: DC

## 2022-02-12 NOTE — Progress Notes (Signed)
? ?Referring Provider: Flossie Buffy, NP ?Primary Care Physician:  Flossie Buffy, NP ? ?Reason for Consultation:  Reflux, eructation, globus, left-sided abdominal pain ? ? ?IMPRESSION:  ?Eructation, globus, and heartburn  ?Left-sided abdominal pain with a history of constipation ?Sigmoid diverticulitis 04/05/18 with complete resolution of symptoms ?Intermittent rectal bleeding that the patient attributes to hemorrhoids ?No prior colon cancer screening ?Recent cholecystectomy for stones and chronic cholecystitis ? ?Symptoms not explained by relatively recent imaging or labs.  ? ? ?PLAN: ?- Increase pantoprazole to 40 mg BID ?- Avoid NSAIDs ?- EGD to evaluate her recent symptoms ?- Colonoscopy for colon cancer screening ? ? ?HPI: Michelle Valenzuela is a 71 y.o. female referred by NP niche for further evaluation of reflux and colon cancer screening.  The history is obtained through the patient with the assistance of a Spanish interpreter and review of her electronic health record.  She has a history of hypothyroidism, hypertriglyceridemia, episodic headaches, urinary tract infections, and hyperglycemia.  She had a lap cholecystectomy 06/27/2021 with pathology showing chronic cholecystitis with cholelithiasis.  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 ? ?She complains of more than 1 year of daily eructation, globus, and heartburn.  There is frequent regurgitation of food.  She has been able to identify certain triggers. No evidence for GI bleeding, iron deficiency anemia, anorexia, unexplained weight loss, dysphagia, odynophagia, persistent vomiting, or gastrointestinal cancer in a first-degree relative.  No nocturnal symptoms.  No significant improvement with Prevacid 15 mg OTC, omeprazole OTC and now pantoprazole 20 mg QD. ? ?She reports an intermittent left sided abdominal pain for many years. It's a fleeting, sharp pain, not associated with eating, defecation, or movement. She is concerned  because the testing that has been done has been negative. She had diverticulitis may years ago but that pain was in the lower abdomen.  ? ?She has a history of hemorrhoids treated with surgery at age 58.  She will have intermittent bleeding with defecation with associated rectal pain that she attributes to hemorrhoids.  ? ?No NSAIDs.  ? ?No prior upper endoscopy.  No prior colonoscopy or colon cancer screening. ? ?CT of the abdomen pelvis 06/25/2021 suggest chronic cholestasis with a superimposed acute component ? ?Abdominal ultrasound 06/25/2021 showed acute cholecystitis with gallbladder wall thickening, multiple stones, and trace pericholecystic cystic fluid ? ?Normal CBC 11/02/2021 ?Normal liver enzymes and lipase 2022 ?Normal TSH 11/02/2021 ? ?There is no known family history of colon cancer or polyps. No family history of stomach cancer or other GI malignancy. No family history of inflammatory bowel disease or celiac.  ? ? ?Past Medical History:  ?Diagnosis Date  ? Anxiety   ? Arthritis   ? Cataracts, bilateral   ? MD just watching   ? Hemorrhoids   ? HSV infection   ? Thyroid disease   ? ? ?Past Surgical History:  ?Procedure Laterality Date  ? CHOLECYSTECTOMY N/A 06/27/2021  ? Procedure: LAPAROSCOPIC CHOLECYSTECTOMY;  Surgeon: Kinsinger, Arta Bruce, MD;  Location: [facility];  Service: General;  Laterality: N/A;  ? HEMORRHOID SURGERY    ? OVARIAN CYST SURGERY    ? multiple cysts  ? ? ? ?Current Outpatient Medications  ?Medication Sig Dispense Refill  ? methimazole (TAPAZOLE) 10 MG tablet TAKE 1 TABLET BY MOUTH TWICE DAILY, ADDITIONAL REFILLS FROM ENDOCRINOLOGY 180 tablet 0  ? pantoprazole (PROTONIX) 20 MG tablet Take 1 tablet (20 mg total) by mouth daily. 90 tablet 1  ? ?No current facility-administered medications for this  visit.  ? ? ?Allergies as of 02/12/2022  ? (No Known Allergies)  ? ? ?Family History  ?Problem Relation Age of Onset  ? Heart attack Father   ? Heart attack Paternal Grandmother   ? Kidney Stones Son    ? Diabetes Neg Hx   ? Hypertension Neg Hx   ? Colon cancer Neg Hx   ? Rectal cancer Neg Hx   ? Stomach cancer Neg Hx   ? ? ?Social History  ? ?Socioeconomic History  ? Marital status: Married  ?  Spouse name: Not on file  ? Number of children: 4  ? Years of education: Not on file  ? Highest education level: Bachelor's degree (e.g., BA, AB, BS)  ?Occupational History  ? Occupation: retired  ?Tobacco Use  ? Smoking status: Never  ? Smokeless tobacco: Never  ?Vaping Use  ? Vaping Use: Never used  ?Substance and Sexual Activity  ? Alcohol use: Not Currently  ? Drug use: Never  ? Sexual activity: Yes  ?  Birth control/protection: Post-menopausal  ?Other Topics Concern  ? Not on file  ?Social History Narrative  ? Not on file  ? ?Social Determinants of Health  ? ?Financial Resource Strain: Not on file  ?Food Insecurity: No Food Insecurity  ? Worried About Charity fundraiser in the Last Year: Never true  ? Ran Out of Food in the Last Year: Never true  ?Transportation Needs: Unmet Transportation Needs  ? Lack of Transportation (Medical): Yes  ? Lack of Transportation (Non-Medical): Yes  ?Physical Activity: Not on file  ?Stress: Not on file  ?Social Connections: Not on file  ?Intimate Partner Violence: Not on file  ? ? ?Review of Systems: ?12 system ROS is negative except as noted above with addition of arthritis, back pain, pain in the eyes, muscle pains, night sweats, sore throat, urinary symptoms and voice changes that she attributes to her thyroid.  ? ?Physical Exam: ?General:   Alert,  well-nourished, pleasant and cooperative in NAD ?Head:  Normocephalic and atraumatic. ?Eyes:  Sclera clear, no icterus.   Conjunctiva pink. ?Ears:  Normal auditory acuity. ?Nose:  No deformity, discharge,  or lesions. ?Mouth:  No deformity or lesions.   ?Neck:  Supple; no masses or thyromegaly. ?Lungs:  Clear throughout to auscultation.   No wheezes. ?Heart:  Regular rate and rhythm; no murmurs. ?Abdomen:  Soft, nontender, nondistended,  normal bowel sounds, no rebound or guarding. No hepatosplenomegaly.   ?Rectal:  Deferred  ?Msk:  Symmetrical. No boney deformities ?LAD: No inguinal or umbilical LAD ?Extremities:  No clubbing or edema. ?Neurologic:  Alert and  oriented x4;  grossly nonfocal ?Skin:  Intact without significant lesions or rashes. ?Psych:  Alert and cooperative. Normal mood and affect. ? ?I spent over 45 minutes, including in depth chart review, independent review of results, face-to-face time with the patient, coordinating care, ordering studies and medications as appropriate, and documentation.   ? ?Timoty Bourke L. Tarri Glenn, MD, MPH ?02/12/2022, 10:28 AM ? ? ? ?  ?

## 2022-02-12 NOTE — Patient Instructions (Addendum)
Fue un placer brindarle atenci?n hoy. Con base en nuestra discusi?n, les ofrezco mis recomendaciones a continuaci?n: ? ?RECOMENDACI?N(ES): ? ?He recomendado una endoscopia superior y [place] colonoscopia. ? ?Mientras tanto, recomiendo aumentar su pantoprazol a 40 mg dos veces al d?a. ? ?MEDICAMENTOS RECETADOS): ? ?Hemos enviado los siguientes medicamentos a su farmacia: ? ? Pantoprazol 40 mg dos veces al d?a 30-60 minutos antes del desayuno y Secondary school teacher. ? ?COLONOSCOPIA/ENDOSCOPIA SUPERIOR: ? ? Ha sido programado para una colonoscopia/endoscopia digestiva alta. Siga las instrucciones escritas que se le dieron en su visita de hoy. ? ?DEBERES: ? ? Recoja sus suministros de preparaci?n en la farmacia dentro de los pr?ximos 1 a 3 d?as. ? ?CONSEJOS PARA LA COLONOSCOPIA: ? ? Para reducir las n?useas y la deshidrataci?n, mant?ngase bien hidratado durante 3 o 4 d?as antes del examen. ? Para prevenir la irritaci?n de la piel/hemorroides, antes de limpiar, aplique A&Dointment o vaselina en el papel higi?nico. ? Mantenga una toalla o almohadilla sobre la cama. ? ANTES DE COMENZAR SU PREPARACI?N, beba 64 oz de l?quidos claros por la ma?ana. ?Esto ayudar? a enjuagar el colon y garantizar? que est? bien hidratado! ?NOTA: esto se suma a los l?quidos necesarios para completar su preparaci?n. ? El uso de un caramelo duro con sabor, como el [place] [employer] [place], puede Investment banker, operational parte del sabor de la preparaci?n y puede prevenir algunas n?useas. ? ? ?HACER UN SEGUIMIENTO: ? ? Despu?s de su procedimiento, recibir? Product manager del personal de mi oficina con mi recomendaci?n para el seguimiento. ? ?IMC: ? ? Si tiene 65 a?os o m?s, su ?ndice de masa corporal debe estar entre 64 y 25. Su ?ndice de masa corporal es de 32,86 kg/m?Marland Kitchen Si esto est? fuera del rango mencionado anteriormente, considere hacer un seguimiento con su proveedor de atenci?n primaria. ? ?MI CARTA: ? ?Los proveedores de Financial controller GI desean alentarlo a que use MYCHART para  comunicarse con los proveedores para solicitudes o preguntas que no sean urgentes. Debido a los Astronomer de espera en el tel?fono, enviar un mensaje a su proveedor por [employer] puede ser una forma m?s r?pida y eficiente de Tax adviser. Espere 48 horas h?biles para obtener [employer]. Recuerde que esto es para solicitudes no urgentes. ? ??Gracias por confiar en m? para su cuidado gastrointestinal! ? ?Thornton Park, MD, MPH ? ?__________________________________________________________ ? ?It was my pleasure to provide care to you today. Based on our discussion, I am providing you with my recommendations below: ? ?RECOMMENDATION(S):  ? ?I have recommended an upper endoscopy and a colonoscopy.  ? ?In the meantime, I recommend increasing your pantoprazole to 40 mg twice daily ? ?PRESCRIPTION MEDICATION(S):  ? ?We have sent the following medication(s) to your pharmacy: ? ?Pantoprazole 40 mg twice daily 30-60 minutes before breakfast and dinner.  ? ?COLONOSCOPY/UPPER ENDOSCOPY:  ? ?You have been scheduled for a colonoscopy/upper endoscopy. Please follow written instructions given to you at your visit today.  ? ?PREP:  ? ?Please pick up your prep supplies at the pharmacy within the next 1-3 days. ? ?COLONOSCOPY TIPS: ? ?To reduce nausea and dehydration, stay well hydrated for 3-4 days prior to the exam.  ?To prevent skin/hemorrhoid irritation - prior to wiping, put A&Dointment or vaseline on the toilet paper. ?Keep a towel or pad on the bed.  ?BEFORE STARTING YOUR PREP, drink  64oz of clear liquids in the morning. This will help to flush the colon and will ensure you are well hydrated!!!!  ?NOTE - This is  in addition to the fluids required for to complete your prep. ?Use of a flavored hard candy, such as grape Anise Salvo, can counteract some of the flavor of the prep and may prevent some nausea.  ? ? ?FOLLOW UP: ? ?I would like for you to follow up with me in . Please call the office at [phone]  to schedule your appointment. ?After your procedure, you will receive a call from my office staff regarding my recommendation for follow up. ? ?BMI: ? ?If you are age 72 or older, your body mass index should be between 23-30. Your Body mass index is 32.86 kg/m?Marland Kitchen If this is out of the aforementioned range listed, please consider follow up with your Primary Care Provider. ? ?MY CHART: ? ?The [place] [facility] providers would like to encourage you to use [facility] to communicate with providers for non-urgent requests or questions.  Due to long hold times on the telephone, sending your provider a message by [facility] may be a faster and more efficient way to get a response.  Please allow 48 business hours for a response.  Please remember that this is for non-urgent requests.  ? ?Thank you for trusting me with your gastrointestinal care!   ? ?Thornton Park, MD, MPH ? ?

## 2022-03-25 ENCOUNTER — Encounter: Payer: Self-pay | Admitting: Certified Registered Nurse Anesthetist

## 2022-03-28 ENCOUNTER — Encounter: Payer: Self-pay | Admitting: Certified Registered Nurse Anesthetist

## 2022-03-29 ENCOUNTER — Ambulatory Visit (AMBULATORY_SURGERY_CENTER): Payer: PPO | Admitting: Gastroenterology

## 2022-03-29 ENCOUNTER — Telehealth: Payer: Self-pay

## 2022-03-29 ENCOUNTER — Encounter: Payer: Self-pay | Admitting: Gastroenterology

## 2022-03-29 VITALS — BP 106/67 | HR 82 | Temp 97.7°F | Resp 12 | Ht 60.0 in | Wt 168.0 lb

## 2022-03-29 DIAGNOSIS — D120 Benign neoplasm of cecum: Secondary | ICD-10-CM | POA: Diagnosis not present

## 2022-03-29 DIAGNOSIS — K2950 Unspecified chronic gastritis without bleeding: Secondary | ICD-10-CM | POA: Diagnosis not present

## 2022-03-29 DIAGNOSIS — Z1211 Encounter for screening for malignant neoplasm of colon: Secondary | ICD-10-CM | POA: Diagnosis not present

## 2022-03-29 DIAGNOSIS — K2970 Gastritis, unspecified, without bleeding: Secondary | ICD-10-CM | POA: Diagnosis not present

## 2022-03-29 DIAGNOSIS — K317 Polyp of stomach and duodenum: Secondary | ICD-10-CM | POA: Diagnosis not present

## 2022-03-29 DIAGNOSIS — R12 Heartburn: Secondary | ICD-10-CM

## 2022-03-29 DIAGNOSIS — R0989 Other specified symptoms and signs involving the circulatory and respiratory systems: Secondary | ICD-10-CM

## 2022-03-29 DIAGNOSIS — K219 Gastro-esophageal reflux disease without esophagitis: Secondary | ICD-10-CM

## 2022-03-29 DIAGNOSIS — R109 Unspecified abdominal pain: Secondary | ICD-10-CM

## 2022-03-29 DIAGNOSIS — R142 Eructation: Secondary | ICD-10-CM | POA: Diagnosis not present

## 2022-03-29 DIAGNOSIS — R09A2 Foreign body sensation, throat: Secondary | ICD-10-CM

## 2022-03-29 HISTORY — PX: UPPER GASTROINTESTINAL ENDOSCOPY: SHX188

## 2022-03-29 HISTORY — PX: COLONOSCOPY: SHX174

## 2022-03-29 MED ORDER — SODIUM CHLORIDE 0.9 % IV SOLN: 500.0000 mL | Freq: Once | INTRAVENOUS | Status: DC

## 2022-03-29 NOTE — Progress Notes (Signed)
Referring Provider: Flossie Buffy, NP Primary Care Physician:  Flossie Buffy, NP  Indication for EGD: Eructation, globus, left-sided abdominal pain and heartburn Indication for Colonoscopy:  Colon cancer screening  IMPRESSION:  Eructation, globus, and heartburn  Left-sided abdominal pain with a history of constipation No prior colon cancer screening Appropriate candidate for monitored anesthesia care  PLAN: Colonoscopy in the [place] today   HPI: Michelle Valenzuela is a 71 y.o. female presents for endoscopic evaluation of irritation, globus, heartburn, and left-sided abdominal pain as well as for screening colonoscopy.  She complains of more than 1 year of daily eructation, globus, and heartburn.  There is frequent regurgitation of food.  She has been able to identify certain triggers. No evidence for GI bleeding, iron deficiency anemia, anorexia, unexplained weight loss, dysphagia, odynophagia, persistent vomiting, or gastrointestinal cancer in a first-degree relative.  No nocturnal symptoms.  No significant improvement with Prevacid 15 mg OTC, omeprazole OTC and now pantoprazole 20 mg QD.   She reports an intermittent left sided abdominal pain for many years. It's a fleeting, sharp pain, not associated with eating, defecation, or movement. She is concerned because the testing that has been done has been negative. She had diverticulitis may years ago but that pain was in the lower abdomen.    She has a history of hemorrhoids treated with surgery at age 71.  She will have intermittent bleeding with defecation with associated rectal pain that she attributes to hemorrhoids.    No NSAIDs.    No prior upper endoscopy.  No prior colonoscopy or colon cancer screening.   CT of the abdomen pelvis 06/25/2021 suggest chronic cholestasis with a superimposed acute component   Abdominal ultrasound 06/25/2021 showed acute cholecystitis with gallbladder wall thickening, multiple stones,  and trace pericholecystic cystic fluid   Normal CBC 11/02/2021 Normal liver enzymes and lipase 2022 Normal TSH 11/02/2021   There is no known family history of colon cancer or polyps. No family history of stomach cancer or other GI malignancy. No family history of inflammatory bowel disease or celiac.    Past Medical History:  Diagnosis Date   Anxiety    Arthritis    Cataracts, bilateral    MD just watching    Hemorrhoids    HSV infection    Thyroid disease     Past Surgical History:  Procedure Laterality Date   CHOLECYSTECTOMY N/A 06/27/2021   Procedure: LAPAROSCOPIC CHOLECYSTECTOMY;  Surgeon: Kinsinger, Arta Bruce, MD;  Location: [place];  Service: General;  Laterality: N/A;   COLONOSCOPY  03/29/2022   HEMORRHOID SURGERY     OVARIAN CYST SURGERY     multiple cysts   UPPER GASTROINTESTINAL ENDOSCOPY  03/29/2022    Current Outpatient Medications  Medication Sig Dispense Refill   aspirin EC 81 MG tablet Take 81 mg by mouth daily. Swallow whole.     methimazole (TAPAZOLE) 10 MG tablet TAKE 1 TABLET BY MOUTH TWICE DAILY, ADDITIONAL REFILLS FROM ENDOCRINOLOGY 180 tablet 0   pantoprazole (PROTONIX) 40 MG tablet Take 1 tablet (40 mg total) by mouth 2 (two) times daily before a meal. 180 tablet 3   Current Facility-Administered Medications  Medication Dose Route Frequency Provider Last Rate Last Admin   0.9 %  sodium chloride infusion  500 mL Intravenous Once Thornton Park, MD        Allergies as of 03/29/2022   (No Known Allergies)    Family History  Problem Relation Age of Onset   Heart attack Father  Heart attack Paternal Grandmother    Kidney Stones Son    Diabetes Neg Hx    Hypertension Neg Hx    Colon cancer Neg Hx    Rectal cancer Neg Hx    Stomach cancer Neg Hx    Colon polyps Neg Hx    Esophageal cancer Neg Hx      Physical Exam: General:   Alert,  well-nourished, pleasant and cooperative in NAD Head:  Normocephalic and atraumatic. Eyes:  Sclera  clear, no icterus.   Conjunctiva pink. Mouth:  No deformity or lesions.   Neck:  Supple; no masses or thyromegaly. Lungs:  Clear throughout to auscultation.   No wheezes. Heart:  Regular rate and rhythm; no murmurs. Abdomen:  Soft, non-tender, nondistended, normal bowel sounds, no rebound or guarding.  Msk:  Symmetrical. No boney deformities LAD: No inguinal or umbilical LAD Extremities:  No clubbing or edema. Neurologic:  Alert and  oriented x4;  grossly nonfocal Skin:  No obvious rash or bruise. Psych:  Alert and cooperative. Normal mood and affect.     Studies/Results: No results found.    Devun Anna L. Tarri Glenn, MD, MPH 03/29/2022, 9:10 AM

## 2022-03-29 NOTE — Progress Notes (Signed)
0913 Robinul 0.1 mg IV given due large amount of secretions upon assessment.  MD made aware, vss 

## 2022-03-29 NOTE — Progress Notes (Signed)
Report given to PACU, vss 

## 2022-03-29 NOTE — Patient Instructions (Signed)
Handout on polyps and diverticulosis given.Add a daily stool bulking agent like metamucil.   USTED TUVO UN PROCEDIMIENTO ENDOSCPICO HOY EN EL [facility]:   Lea el informe del procedimiento que se le entreg para cualquier pregunta especfica sobre lo que se Primary school teacher.  Si el informe del examen no responde a sus preguntas, por favor llame a su gastroenterlogo para aclararlo.  Si usted solicit que no se le den [employer] de lo que se Estate manager/land agent en su procedimiento al [employer] va a cuidar, entonces el informe del procedimiento se ha incluido en un sobre sellado para que usted lo revise despus cuando le sea ms conveniente.   LO QUE PUEDE ESPERAR: Algunas sensaciones de hinchazn en el abdomen.  Puede tener ms gases de lo normal.  El caminar puede ayudarle a eliminar el aire que se le puso en el tracto gastrointestinal durante el procedimiento y reducir la hinchazn.  Si le hicieron una endoscopia inferior (como una colonoscopia o una sigmoidoscopia flexible), podra notar manchas de sangre en las heces fecales o en el papel higinico.  Si se someti a una preparacin intestinal para su procedimiento, es posible que no tenga una evacuacin intestinal normal durante [employer].   Tenga en cuenta:  Es posible que note un poco de irritacin y congestin en la nariz o algn drenaje.  Esto es debido al oxgeno [employer] durante su procedimiento.  No hay que preocuparse y esto debe desaparecer ms o Scientist, research (medical).   SNTOMAS PARA REPORTAR INMEDIATAMENTE:  Despus de una endoscopia inferior (colonoscopia o sigmoidoscopia flexible):  Cantidades excesivas de sangre en las heces fecales  Sensibilidad significativa o empeoramiento de los dolores abdominales   Hinchazn aguda del abdomen que antes no tena   Fiebre de 100F o ms   Despus de la endoscopia superior (EGD)  Vmitos de Biochemist, clinical o material como caf molido   Dolor en el pecho o dolor debajo de los omplatos  que antes no tena   Dolor o dificultad persistente para tragar  Falta de aire que antes no tena   Fiebre de 100F o ms  Heces fecales negras y pegajosas   Para asuntos urgentes o de Freight forwarder, puede comunicarse con un gastroenterlogo a cualquier hora llamando al [phone].  DIETA:  Recomendamos una comida pequea al principio, pero luego puede continuar con su dieta normal.  Tome muchos lquidos, Teacher, adult education las bebidas alcohlicas durante 24 horas.    ACTIVIDAD:  Debe planear tomarse las cosas con calma por el resto del da y no debe CONDUCIR ni usar maquinaria pesada Programmer, applications (debido a los medicamentos de sedacin utilizados durante el examen).     SEGUIMIENTO: Nuestro personal llamar al nmero que aparece en su historial al siguiente da hbil de su procedimiento para ver cmo se siente y para responder cualquier pregunta o inquietud que pueda tener con respecto a la informacin que se le dio despus del procedimiento. Si no podemos contactarle, le dejaremos un mensaje.  Sin embargo, si se siente bien y no tiene Paediatric nurse, no es necesario que nos devuelva la llamada.  Asumiremos que ha regresado a sus actividades diarias normales sin incidentes. Si se le tomaron algunas biopsias, le contactaremos por telfono o por carta en las prximas 3 semanas.  Si no ha sabido [employer] biopsias en el transcurso de 3 semanas, por favor llmenos al [phone].   FIRMAS/CONFIDENCIALIDAD: Usted y/o el acompaante que le cuide han [place]  documentos que se ingresarn en su historial mdico electrnico.  Estas firmas atestiguan el hecho de que la informacin anterior

## 2022-03-29 NOTE — Telephone Encounter (Signed)
-----   Message from Thornton Park, MD sent at 03/29/2022  9:56 AM EDT ----- Please arrange referral to surgery Re:: Hypertrophied anal papillae.  Thank you.  KLB

## 2022-03-29 NOTE — Op Note (Signed)
[facility] Patient Name: Michelle Valenzuela Procedure Date: 03/29/2022 9:12 AM MRN: [id number] Endoscopist: Thornton Park MD, MD Age: 71 Referring MD:  Date of Birth: 09-23-1951 Gender: Female Account #: [id number] Procedure:                Upper GI endoscopy Indications:              Heartburn, Eructation, Globus sensation Medicines:                Monitored Anesthesia Care Procedure:                Pre-Anesthesia Assessment:                           - Prior to the procedure, a History and Physical                            was performed, and patient medications and                            allergies were reviewed. The patient's tolerance of                            previous anesthesia was also reviewed. The risks                            and benefits of the procedure and the sedation                            options and risks were discussed with the patient.                            All questions were answered, and informed consent                            was obtained. Prior Anticoagulants: The patient has                            taken no previous anticoagulant or antiplatelet                            agents. ASA Grade Assessment: II - A patient with                            mild systemic disease. After reviewing the risks                            and benefits, the patient was deemed in                            satisfactory condition to undergo the procedure.                           After obtaining informed consent, the endoscope was  passed under direct vision. Throughout the                            procedure, the patient's blood pressure, pulse, and                            oxygen saturations were monitored continuously. The                            Endoscope was introduced through the mouth, and                            advanced to the third part of duodenum. The upper                            GI  endoscopy was accomplished without difficulty.                            The patient tolerated the procedure well. Scope In: Scope Out: Findings:                 The esophagus was normal. Biopsies were obtained                            from the proximal and distal esophagus with cold                            forceps for histology.                           Multiple sessile polyps were found in the gastric                            fundus and in the gastric body. Biopsies were taken                            from four polyps with a cold forceps for histology.                            Estimated blood loss was minimal.                           The entire examined stomach was normal. Biopsies                            were taken from the antrum, body, and fundus with a                            cold forceps for histology. Estimated blood loss                            was minimal.  The examined duodenum was normal. Complications:            No immediate complications. Estimated Blood Loss:     Estimated blood loss was minimal. Impression:               - Normal esophagus. Biopsied.                           - Multiple gastric polyps. Biopsied.                           - Normal stomach. Biopsied.                           - Normal examined duodenum. Recommendation:           - Patient has a contact number available for                            emergencies. The signs and symptoms of potential                            delayed complications were discussed with the                            patient. Return to normal activities tomorrow.                            Written discharge instructions were provided to the                            patient.                           - Resume previous diet.                           - Continue present medications.                           - Await pathology results. Thornton Park MD, MD 03/29/2022 9:50:21  AM This report has been signed electronically.

## 2022-03-29 NOTE — Telephone Encounter (Signed)
Referral faxed to [facility] 

## 2022-03-29 NOTE — Op Note (Signed)
[place] Patient Name: Michelle Valenzuela Procedure Date: 03/29/2022 9:11 AM MRN: [id number] Endoscopist: Thornton Park MD, MD Age: 71 Referring MD:  Date of Birth: 08-Mar-1951 Gender: Female Account #: [id number] Procedure:                Colonoscopy Indications:              Screening for colorectal malignant neoplasm, This                            is the patient's first colonoscopy Medicines:                Monitored Anesthesia Care Procedure:                Pre-Anesthesia Assessment:                           - Prior to the procedure, a History and Physical                            was performed, and patient medications and                            allergies were reviewed. The patient's tolerance of                            previous anesthesia was also reviewed. The risks                            and benefits of the procedure and the sedation                            options and risks were discussed with the patient.                            All questions were answered, and informed consent                            was obtained. Prior Anticoagulants: The patient has                            taken no previous anticoagulant or antiplatelet                            agents. ASA Grade Assessment: II - A patient with                            mild systemic disease. After reviewing the risks                            and benefits, the patient was deemed in                            satisfactory condition to undergo the procedure.  After obtaining informed consent, the colonoscope                            was passed under direct vision. Throughout the                            procedure, the patient's blood pressure, pulse, and                            oxygen saturations were monitored continuously. The                            CF [id number] #[id number] was introduced through the anus                            and  advanced to the 3 cm into the ileum. A second                            forward view of the right colon was performed. The                            colonoscopy was performed without difficulty. The                            patient tolerated the procedure well. The quality                            of the bowel preparation was good. The terminal                            ileum, ileocecal valve, appendiceal orifice, and                            rectum were photographed. Scope In: 9:31:40 AM Scope Out: 9:49:06 AM Scope Withdrawal Time: 0 hours 12 minutes 41 seconds  Total Procedure Duration: 0 hours 17 minutes 26 seconds  Findings:                 The perianal exam findings include hypertrophied                            anal papilla(e). This appears to rest within the                            anal canal.                           Multiple small and large-mouthed diverticula were                            found in the entire colon.                           A 3 mm polyp was found in the cecum. The polyp was  flat. The polyp was removed with a cold snare.                            Resection and retrieval were complete. Estimated                            blood loss was minimal.                           The exam was otherwise without abnormality on                            direct and retroflexion views. Complications:            No immediate complications. Estimated Blood Loss:     Estimated blood loss was minimal. Impression:               - Hypertrophied anal papilla(e) found on perianal                            exam. This appears to flop in and out of the anal                            canal, often resting at the anus.                           - Diverticulosis in the entire examined colon.                           - One 3 mm polyp in the cecum, removed with a cold                            snare. Resected and retrieved.                            - The examination was otherwise normal on direct                            and retroflexion views. Recommendation:           - Patient has a contact number available for                            emergencies. The signs and symptoms of potential                            delayed complications were discussed with the                            patient. Return to normal activities tomorrow.                            Written discharge instructions were provided to the  patient.                           - Follow a high fiber diet. Drink at least 64                            ounces of water daily. Add a daily stool bulking                            agent such as psyllium (an exampled would be                            Metamucil).                           - Continue present medications.                           - Await pathology results.                           - Referral to surgery to consider removal of the                            anal papillae.                           - Repeat colonoscopy date to be determined after                            pending pathology results are reviewed for                            surveillance.                           - Emerging evidence supports eating a diet of                            fruits, vegetables, grains, calcium, and yogurt                            while reducing red meat and alcohol may reduce the                            risk of colon cancer. Thornton Park MD, MD 03/29/2022 9:56:23 AM This report has been signed electronically.

## 2022-03-29 NOTE — Progress Notes (Signed)
Called to room to assist during endoscopic procedure.  Patient ID and intended procedure confirmed with present staff. Received instructions for my participation in the procedure from the performing physician.  

## 2022-03-29 NOTE — Progress Notes (Signed)
Interpreter, Clerance Lav, present during recovery.

## 2022-03-29 NOTE — Progress Notes (Signed)
Reviewed and updated medical record

## 2022-03-30 ENCOUNTER — Telehealth: Payer: Self-pay | Admitting: *Deleted

## 2022-03-30 NOTE — Telephone Encounter (Signed)
  Follow up Call-     03/29/2022    8:07 AM  Call back number  Post procedure Call Back phone  # [phone]- only speaks spanish  Permission to leave phone message Yes     Patient questions:  Do you have a fever, pain , or abdominal swelling? No. Pain Score  0 *  Have you tolerated food without any problems? Yes.    Have you been able to return to your normal activities? Yes.    Do you have any questions about your discharge instructions: Diet   No. Medications  No. Follow up visit  No.  Do you have questions or concerns about your Care? No.  Actions: * If pain score is 4 or above: No action needed, pain <4.  Patient only speaks spanish but said she was "Ok"

## 2022-05-09 ENCOUNTER — Telehealth: Payer: Self-pay | Admitting: Pharmacy Technician

## 2022-05-09 ENCOUNTER — Ambulatory Visit (INDEPENDENT_AMBULATORY_CARE_PROVIDER_SITE_OTHER): Payer: PPO | Admitting: Gastroenterology

## 2022-05-09 ENCOUNTER — Encounter: Payer: Self-pay | Admitting: Gastroenterology

## 2022-05-09 ENCOUNTER — Other Ambulatory Visit (HOSPITAL_COMMUNITY): Payer: Self-pay

## 2022-05-09 VITALS — BP 120/68 | HR 74 | Ht 61.0 in | Wt 170.0 lb

## 2022-05-09 DIAGNOSIS — R142 Eructation: Secondary | ICD-10-CM

## 2022-05-09 DIAGNOSIS — R109 Unspecified abdominal pain: Secondary | ICD-10-CM

## 2022-05-09 MED ORDER — PANTOPRAZOLE SODIUM 40 MG PO TBEC: 40.0000 mg | DELAYED_RELEASE_TABLET | Freq: Two times a day (BID) | ORAL | 3 refills | Status: DC

## 2022-05-09 MED ORDER — RIFAXIMIN 550 MG PO TABS: 550.0000 mg | ORAL_TABLET | Freq: Three times a day (TID) | ORAL | 0 refills | Status: AC

## 2022-05-09 NOTE — Progress Notes (Signed)
Referring Provider: Flossie Buffy, NP Primary Care Physician:  Flossie Buffy, NP  Chief Complaint:  Reflux, eructation, globus, left-sided abdominal pain   IMPRESSION:  Eructation, globus, and heartburn     - esophageal biopsies normal Chronic inactive gastritis on EGD Left-sided abdominal pain with a history of constipation Left-sided diverticulosis with history of sigmoid diverticulitis 04/05/18  Intermittent rectal bleeding that the patient attributes to hemorrhoids Tubular adenoma on colonoscopy 2023 Recent cholecystectomy for stones and chronic cholecystitis  Symptoms not explained by relatively recent imaging, labs, or endoscopic evaluation with EGD and colonoscopy.   PLAN: - Continue pantoprazole to 40 mg BID - Xifaxan 550 mg TID x 14 days, substitute with doxycycline 100 mg BID x 14 days if Xifaxan is cost-prohibitive - Avoid NSAIDs - Add a daily stool bulking agent such as Metamucil or Benefiber - Surveillance colonoscopy not recommended due to age - Office follow-up in 6-8 weeks, earlier if needed    HPI: Michelle Valenzuela is a 71 y.o. female who returns in follow-up after endoscopic evaluation. The interval history is obtained through the patient with the assistance of a Spanish interpreter and review of her electronic health record.  She has a history of hypothyroidism, hypertriglyceridemia, episodic headaches, urinary tract infections, and hyperglycemia.  She had a lap cholecystectomy 06/27/2021 with pathology showing chronic cholecystitis with cholelithiasis.  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  At the time of initial consultation 02/12/22 she reported a >1 year of daily eructation, globus, and heartburn.  There is frequent regurgitation of food.  She has been able to identify certain triggers. No evidence for GI bleeding, iron deficiency anemia, anorexia, unexplained weight loss, dysphagia, odynophagia, persistent vomiting, or  gastrointestinal cancer in a first-degree relative.  No nocturnal symptoms.  No significant improvement with Prevacid 15 mg OTC, omeprazole OTC and now pantoprazole 20 mg QD.  She also reported an intermittent left sided abdominal pain for many years. It's a fleeting, sharp pain, not associated with eating, defecation, or movement. She is concerned because the testing that has been done has been negative. She had diverticulitis may years ago but that pain was in the lower abdomen.   She has a history of hemorrhoids treated with surgery at age 53.  She will have intermittent bleeding with defecation with associated rectal pain that she attributes to hemorrhoids.   CT of the abdomen pelvis 06/25/2021 suggest chronic cholestasis with a superimposed acute component  Abdominal ultrasound 06/25/2021 showed acute cholecystitis with gallbladder wall thickening, multiple stones, and trace pericholecystic cystic fluid  Normal CBC 11/02/2021 Normal liver enzymes and lipase 2022 Normal TSH 11/02/2021  EGD 03/29/22: gastric polyps, gastric biopsies showed chronic inactive gastritis and fundic gland polyps, esophageal biopsies were normal  Colonoscopy 03/29/22: pancolonic diverticulosis, cecum 8m tubular adenoma, and hypertrophied anal papillae  She returns in follow-up. She continues to have severe reflux and eructation. She also reports significant borborygmous. She feels like all symptoms started after her diverticulitis and that things have not been the same since that time.     Past Medical History:  Diagnosis Date   Anxiety    Arthritis    Cataracts, bilateral    MD just watching    Hemorrhoids    HSV infection    Thyroid disease     Past Surgical History:  Procedure Laterality Date   CHOLECYSTECTOMY N/A 06/27/2021   Procedure: LAPAROSCOPIC CHOLECYSTECTOMY;  Surgeon: Kinsinger, LArta Bruce MD;  Location: [facility]  Service: General;  Laterality:  N/A;   COLONOSCOPY  03/29/2022   HEMORRHOID SURGERY      OVARIAN CYST SURGERY     multiple cysts   UPPER GASTROINTESTINAL ENDOSCOPY  03/29/2022     Current Outpatient Medications  Medication Sig Dispense Refill   methimazole (TAPAZOLE) 10 MG tablet TAKE 1 TABLET BY MOUTH TWICE DAILY, ADDITIONAL REFILLS FROM ENDOCRINOLOGY 180 tablet 0   rifaximin (XIFAXAN) 550 MG TABS tablet Take 1 tablet (550 mg total) by mouth 3 (three) times daily for 14 days. 42 tablet 0   aspirin EC 81 MG tablet Take 81 mg by mouth daily. Swallow whole. (Patient not taking: Reported on 05/09/2022)     pantoprazole (PROTONIX) 40 MG tablet Take 1 tablet (40 mg total) by mouth 2 (two) times daily before a meal. 180 tablet 3   No current facility-administered medications for this visit.    Allergies as of 05/09/2022   (No Known Allergies)      Physical Exam: General:   Alert,  well-nourished, pleasant and cooperative in NAD Head:  Normocephalic and atraumatic. Eyes:  Sclera clear, no icterus.   Conjunctiva pink. Abdomen:  Soft, nontender, nondistended, normal bowel sounds, no rebound or guarding. No hepatosplenomegaly.   Neurologic:  Alert and  oriented x4;  grossly nonfocal Skin:  Intact without significant lesions or rashes. Psych:  Alert and cooperative. Normal mood and affect.  I spent over 30 minutes, including in depth chart review, independent review of results, face-to-face time with the patient, coordinating care, ordering studies and medications as appropriate, and documentation.    Marland Kitchenklbmin  Mell Guia L. Tarri Glenn, MD, MPH 05/09/2022, 4:19 PM

## 2022-05-09 NOTE — Telephone Encounter (Signed)
Patient Advocate Encounter  Received notification from [facility] that prior authorization for XIFAXAN '550MG'$  is required.   PA submitted on 8.9.23 Key [id number] Status is pending    Luciano Cutter, CPhT Patient Advocate Phone: [phone]

## 2022-05-09 NOTE — Patient Instructions (Addendum)
Fue un placer brindarle atencin hoy. Con base en nuestra discusin, les ofrezco mis recomendaciones a continuacin:  RECOMENDACIN(ES):  Agregue un agente de volumen de heces diario como Metamucil o Benefiber  MEDICAMENTOS RECETADOS):  Hemos enviado los siguientes medicamentos a su farmacia:   Xifaxan 550 mg tres veces [place] [address].  Pantoprazol recargado.  NOTA: Si su(s) medicamento(s) requiere(n) una AUTORIZACIN PREVIA, recibiremos una notificacin de su farmacia. Una vez recibido, el proceso de envo para su aprobacin puede demorar entre 7 y [employer] Nos comunicaremos con usted sobre cualquier denegacin que hayamos recibido de su compaa de seguros, as como sobre las alternativas recomendadas por su proveedor.  HACER UN SEGUIMIENTO:   Me gustara que hiciera un seguimiento conmigo en 6 a 8 semanas. Llame a la oficina al [phone] para programar su cita.  IMC:   Si tiene 65 aos o ms, su ndice de masa corporal debe estar entre 23 y 89. Su ndice de masa corporal es de 32,12 kg/m. Si esto est fuera del rango mencionado anteriormente, considere hacer un seguimiento con su proveedor de Midwife.  MI CARTA:  Los proveedores de Financial controller GI desean alentarlo a que use MYCHART para comunicarse con los proveedores para solicitudes o preguntas que no sean urgentes. Debido a los Astronomer de espera en el telfono, enviar un mensaje a su proveedor por [employer] puede ser una forma ms rpida y eficiente de obtener una respuesta. Espere 48 horas hbiles para obtener [employer]. Recuerde que esto es para solicitudes no urgentes.  Gracias por confiar en m para su cuidado gastrointestinal!  Thornton Park, MD, MPH    It was my pleasure to provide care to you today. Based on our discussion, I am providing you with my recommendations below:  RECOMMENDATION(S):   Add a daily stool bulking agent such as Metamucil or Benefiber  PRESCRIPTION  MEDICATION(S):   We have sent the following medication(s) to your pharmacy:  Xifaxan 550 mg three times daily for 14 days. Refilled Pantoprazole.  NOTE: If your medication(s) requires a PRIOR AUTHORIZATION, we will receive notification from your pharmacy. Once received, the process to submit for approval may take up to 7-10 business days. You will be contacted about any denials we have received from your insurance company as well as alternatives recommended by your provider.  FOLLOW UP:  I would like for you to follow up with me in 6-8 weeks. Please call the office at [phone] to schedule your appointment.  BMI:  If you are age 72 or older, your body mass index should be between 23-30. Your Body mass index is 32.12 kg/m. If this is out of the aforementioned range listed, please consider follow up with your Primary Care Provider.  MY CHART:  The [facility] providers would like to encourage you to use [facility] to communicate with providers for non-urgent requests or questions.  Due to long hold times on the telephone, sending your provider a message by [facility] may be a faster and more efficient way to get a response.  Please allow 48 business hours for a response.  Please remember that this is for non-urgent requests.   Thank you for trusting me with your gastrointestinal care!    Thornton Park, MD, MPH

## 2022-05-10 ENCOUNTER — Other Ambulatory Visit (HOSPITAL_COMMUNITY): Payer: Self-pay

## 2022-05-10 NOTE — Telephone Encounter (Signed)
Patient Advocate Encounter  Prior Authorization for XIFAXAN '550MG'$  has been approved.    REQUEST ID: [id number] Effective dates: 8.10.23 through 8.24.23  Torra Pala B. CPhT P: [phone] F: [phone]

## 2022-05-25 ENCOUNTER — Other Ambulatory Visit: Payer: Self-pay

## 2022-05-25 ENCOUNTER — Telehealth: Payer: Self-pay | Admitting: Gastroenterology

## 2022-05-25 NOTE — Telephone Encounter (Signed)
Left message for patient to call back  

## 2022-05-25 NOTE — Telephone Encounter (Signed)
Patients friend called and states she is unable to afford the prescription xifaxan, would like something more cost effective. Please advise, thank you.

## 2022-05-28 NOTE — Telephone Encounter (Signed)
Left message for patient's friend to call back.

## 2022-05-29 MED ORDER — DOXYCYCLINE HYCLATE 100 MG PO TABS: 100.0000 mg | ORAL_TABLET | Freq: Two times a day (BID) | ORAL | 0 refills | Status: AC

## 2022-05-29 NOTE — Telephone Encounter (Signed)
Spoke with patient in regards to medication alternative that has been sent to pharmacy since Xifaxan was too expensive. Pt verbalized all understanding.

## 2022-07-09 ENCOUNTER — Ambulatory Visit (INDEPENDENT_AMBULATORY_CARE_PROVIDER_SITE_OTHER): Payer: PPO | Admitting: Gastroenterology

## 2022-07-09 ENCOUNTER — Encounter: Payer: Self-pay | Admitting: Gastroenterology

## 2022-07-09 ENCOUNTER — Other Ambulatory Visit: Payer: Self-pay | Admitting: Gastroenterology

## 2022-07-09 VITALS — BP 108/80 | HR 80 | Ht 59.0 in | Wt 169.0 lb

## 2022-07-09 DIAGNOSIS — R109 Unspecified abdominal pain: Secondary | ICD-10-CM

## 2022-07-09 DIAGNOSIS — R140 Abdominal distension (gaseous): Secondary | ICD-10-CM | POA: Diagnosis not present

## 2022-07-09 DIAGNOSIS — R142 Eructation: Secondary | ICD-10-CM

## 2022-07-09 MED ORDER — SUCRALFATE 1 G PO TABS: 1.0000 g | ORAL_TABLET | Freq: Three times a day (TID) | ORAL | 1 refills | Status: DC

## 2022-07-09 NOTE — Progress Notes (Signed)
Referring Provider: Flossie Buffy, NP Primary Care Physician:  Flossie Buffy, NP  Chief Complaint:  Reflux, eructation, globus, left-sided abdominal pain   IMPRESSION:  Eructation, globus, and heartburn     - esophageal biopsies normal Chronic inactive gastritis on EGD Left-sided abdominal pain with a history of constipation Left-sided diverticulosis with history of sigmoid diverticulitis 04/05/18  Intermittent rectal bleeding that the patient attributes to hemorrhoids Tubular adenoma on colonoscopy 2023 Recent cholecystectomy for stones and chronic cholecystitis  Symptoms not explained by relatively recent imaging, labs, or endoscopic evaluation with EGD and colonoscopy. Suspected SIBO.    PLAN: - Continue pantoprazole to 40 mg BID - Carafate 1 g QID x 2 weeks (may extend beyond that time if improvement) - SIBO breath test - Avoid NSAIDs - Add a daily stool bulking agent such as Metamucil or Benefiber - Surveillance colonoscopy not recommended due to age - Office follow-up after the breath test   HPI: Michelle Valenzuela is a 71 y.o. female who returns in follow-up after endoscopic evaluation. The interval history is obtained through the patient with the assistance of a Spanish interpreter and review of her electronic health record.  She has a history of hypothyroidism, hypertriglyceridemia, episodic headaches, urinary tract infections, and hyperglycemia.  She had a lap cholecystectomy 06/27/2021 with pathology showing chronic cholecystitis with cholelithiasis.  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  At the time of initial consultation 02/12/22 she reported a >1 year of daily eructation, globus, and heartburn.  There is frequent regurgitation of food.  She has been able to identify certain triggers. No evidence for GI bleeding, iron deficiency anemia, anorexia, unexplained weight loss, dysphagia, odynophagia, persistent vomiting, or gastrointestinal cancer in  a first-degree relative.  No nocturnal symptoms.  No significant improvement with Prevacid 15 mg OTC, omeprazole OTC and now pantoprazole 20 mg QD.  She also reported an intermittent left sided abdominal pain for many years. It's a fleeting, sharp pain, not associated with eating, defecation, or movement. She is concerned because the testing that has been done has been negative. She had diverticulitis may years ago but that pain was in the lower abdomen.   She has a history of hemorrhoids treated with surgery at age 44.  She will have intermittent bleeding with defecation with associated rectal pain that she attributes to hemorrhoids.   CT of the abdomen pelvis 06/25/2021 suggest chronic cholestasis with a superimposed acute component  Abdominal ultrasound 06/25/2021 showed acute cholecystitis with gallbladder wall thickening, multiple stones, and trace pericholecystic cystic fluid  Normal CBC 11/02/2021 Normal liver enzymes and lipase 2022 Normal TSH 11/02/2021  EGD 03/29/22: gastric polyps, gastric biopsies showed chronic inactive gastritis and fundic gland polyps, esophageal biopsies were normal  Colonoscopy 03/29/22: pancolonic diverticulosis, cecum 10m tubular adenoma, and hypertrophied anal papillae  Follow-up 05/09/22. She continues to have severe reflux and eructation. She also reports significant borborygmous. She feels like all symptoms started after her diverticulitis and that things have not been the same since that time.    Follow-up 07/09/22. Returns today after completing 11 of 14 planned days of doxycyline. While on treatment, the abdominal pain improved but she was unable to tolerate the antibiotics due to side effects. Pain is less frequent and less severe. Erucation and borborygmous have improvement. But, she still doesn't feel well and would like to pursue additional evaluation.   Past Medical History:  Diagnosis Date   Anxiety    Arthritis    Cataracts, bilateral  MD just  watching    Hemorrhoids    HSV infection    Thyroid disease     Past Surgical History:  Procedure Laterality Date   CHOLECYSTECTOMY N/A 06/27/2021   Procedure: LAPAROSCOPIC CHOLECYSTECTOMY;  Surgeon: Kinsinger, Arta Bruce, MD;  Location: [facility];  Service: General;  Laterality: N/A;   COLONOSCOPY  03/29/2022   HEMORRHOID SURGERY     OVARIAN CYST SURGERY     multiple cysts   UPPER GASTROINTESTINAL ENDOSCOPY  03/29/2022     Current Outpatient Medications  Medication Sig Dispense Refill   meloxicam (MOBIC) 7.5 MG tablet Take 7.5 mg by mouth daily.     methimazole (TAPAZOLE) 10 MG tablet TAKE 1 TABLET BY MOUTH TWICE DAILY, ADDITIONAL REFILLS FROM ENDOCRINOLOGY 180 tablet 0   pantoprazole (PROTONIX) 40 MG tablet Take 1 tablet (40 mg total) by mouth 2 (two) times daily before a meal. 180 tablet 3   aspirin EC 81 MG tablet Take 81 mg by mouth daily. Swallow whole. (Patient not taking: Reported on 05/09/2022)     No current facility-administered medications for this visit.    Allergies as of 07/09/2022   (No Known Allergies)      Physical Exam: General:   Alert,  well-nourished, pleasant and cooperative in NAD 02/12/22 168 pounds 05/09/22 170 pounds 07/09/22 169 pounds  Head:  Normocephalic and atraumatic. Eyes:  Sclera clear, no icterus.   Conjunctiva pink. Abdomen:  Soft, nontender, nondistended, normal bowel sounds, no rebound or guarding. No hepatosplenomegaly.   Neurologic:  Alert and  oriented x4;  grossly nonfocal Skin:  Intact without significant lesions or rashes. Psych:  Alert and cooperative. Normal mood and affect.  I spent over 30 minutes, including in depth chart review, independent review of results, face-to-face time with the patient, coordinating care, ordering studies and medications as appropriate, and documentation.    Marland Kitchenklbmin  Letasha Kershaw L. Tarri Glenn, MD, MPH 07/09/2022, 3:40 PM

## 2022-07-09 NOTE — Patient Instructions (Addendum)
Hemos enviado los siguientes medicamentos a su farmacia para que los recoja cuando le convenga: Carafate 1 g cuatro veces al da, 30 a 60 minutos antes de las comidas y antes de Acupuncturist durante 2 semanas.  Le han entregado un kit de prueba para Futures trader crecimiento excesivo de bacterias en el intestino delgado (SIBO), que lo completa una empresa llamada [employer]. Asegrese de Building surveyor la prueba por correo utilizando la etiqueta de envo de devolucin que se le entreg junto con Recruitment consultant. Su informacin demogrfica y de seguro ya se envi a la compaa y Armed forces logistics/support/administrative officer en contacto con usted durante las prximas 1 a 2 semanas con respecto a Theme park manager. [employer] cobrar un cargo inicial de [phone] para planes de seguro comerciales y [phone] si paga en efectivo. Asegrese de hablar con [employer] ANTES de realizarse la prueba para ver si han recibido informacin de su compaa de seguros sobre cunto costar de su bolsillo la prueba, si corresponde. Tenga en cuenta que recibir [employer] del nmero de telfono [phone] o un nmero similar. Si no recibe noticias suyas dentro de [employer], llame a [name] al [phone] o llame a [employer] directamente al [phone].  _______________________________________________________  Nonie Hoyer tiene 72 aos o ms, su ndice de masa corporal debe estar entre 23 y 60. Su ndice de masa corporal es de 34,13 kg/m. Si esto est fuera del rango mencionado anteriormente, considere realizar un seguimiento con su proveedor de Midwife.  Si tiene 72 aos o menos, su ndice de [employer] corporal debe estar entre 34 y 36. Su ndice de masa corporal es de 34,13 kg/m. Si esto est fuera del rango mencionado anteriormente, considere realizar un seguimiento con su proveedor de Midwife.  ________________________________________________________  [employer] de [place] [facility] desean alentarlo a Risk manager MYCHART para comunicarse  con los proveedores para solicitudes o preguntas que no sean urgentes. Debido a los Astronomer de espera en el telfono, enviar un mensaje a su proveedor a travs de [employer] puede ser una forma ms rpida y eficiente de obtener una respuesta. Espere 48 horas hbiles para recibir Aetna. Recuerde que esto es para solicitudes no urgentes. _______________________________________________________  We have sent the following medications to your pharmacy for you to pick up at your convenience: Carafate 1 g four times daily 30-60 minutes before meals and bedtime for 2 weeks.   You have been given a testing kit to check for small intestine bacterial overgrowth (SIBO) which is completed by a company named [employer]. Make sure to return your test in the mail using the return mailing label given to you along with the kit. Your demographic and insurance information have already been sent to the company and they should be in contact with you over the next 1-2 weeks regarding this test. [employer] will collect an upfront charge of $99.74 for commercial insurance plans and $209.74 is you are paying cash. Make sure to discuss with [employer] PRIOR to having the test to see if they have gotten information from your insurance company as to how much your testing will cost out of pocket, if any. Please keep in mind that you will be getting a call from phone number [phone] or a similar number. If you do not hear from them within this time frame, please call our office at [phone] or call [employer] directly at [phone].   _______________________________________________________  If you are age 72 or older, your body mass index should be between 23-30. Your Body mass index is 34.Apollo  kg/m. If this is out of the aforementioned range listed, please consider follow up with your Primary Care Provider.  If you are age 72 or younger, your body mass index should be between 19-25. Your  Body mass index is 34.13 kg/m. If this is out of the aformentioned range listed, please consider follow up with your Primary Care Provider.   ________________________________________________________  The [place] [facility] providers would like to encourage you to use [facility] to communicate with providers for non-urgent requests or questions.  Due to long hold times on the telephone, sending your provider a message by [facility] may be a faster and more efficient way to get a response.  Please allow 48 business hours for a response.  Please remember that this is for non-urgent requests.  _______________________________________________________

## 2022-07-23 NOTE — Progress Notes (Unsigned)
Name: Michelle Valenzuela  MRN/ DOB: [id number], November 09, 1950    Age/ Sex: 71 y.o., female    PCP: Flossie Buffy, NP   Reason for Endocrinology Evaluation: Hyperthyroidism     Date of Initial Endocrinology Evaluation: 07/23/2022     HPI: [name] is a 71 y.o. female with a past medical history of Hyperthyroidism. The patient presented for initial endocrinology clinic visit on 07/23/2022 for consultative assistance with her Hyperthyroidism.   Pt has been diagnosed with hyperthyroidism in 2012 .  She has been on methimazole since her diagnosis  She has a history of, this was done in [place], these records are not available.  Prior TRAb negative   She was [facility] by [facility], prior to that she is only in April 2020   She has been out of methimazole for ~ 2 days   Denies local neck swelling  Has occasional palpitations   Has occasional diarrhea and palpitations  Denies palpitations    Methimazole 10 mg BID     HISTORY:  Past Medical History:  Past Medical History:  Diagnosis Date   Anxiety    Arthritis    Cataracts, bilateral    MD just watching    Hemorrhoids    HSV infection    Thyroid disease    Past Surgical History:  Past Surgical History:  Procedure Laterality Date   CHOLECYSTECTOMY N/A 06/27/2021   Procedure: LAPAROSCOPIC CHOLECYSTECTOMY;  Surgeon: Kinsinger, Arta Bruce, MD;  Location: [facility];  Service: General;  Laterality: N/A;   COLONOSCOPY  03/29/2022   HEMORRHOID SURGERY     OVARIAN CYST SURGERY     multiple cysts   UPPER GASTROINTESTINAL ENDOSCOPY  03/29/2022    Social History:  reports that she has never smoked. She has never used smokeless tobacco. She reports that she does not drink alcohol and does not use drugs. Family History: family history includes Heart attack in her father and paternal grandmother; Kidney Stones in her son.   HOME MEDICATIONS: Allergies as of 07/24/2022   No Known Allergies       Medication List        Accurate as of July 23, 2022  4:23 PM. If you have any questions, ask your nurse or doctor.          aspirin EC 81 MG tablet Take 81 mg by mouth daily. Swallow whole.   meloxicam 7.5 MG tablet Commonly known as: MOBIC Take 7.5 mg by mouth daily.   methimazole 10 MG tablet Commonly known as: TAPAZOLE TAKE 1 TABLET BY MOUTH TWICE DAILY, ADDITIONAL REFILLS FROM ENDOCRINOLOGY   pantoprazole 40 MG tablet Commonly known as: PROTONIX Take 1 tablet (40 mg total) by mouth 2 (two) times daily before a meal.   sucralfate 1 g tablet Commonly known as: CARAFATE TAKE 1 TABLET(1 GRAM) BY MOUTH FOUR TIMES DAILY AT BEDTIME WITH MEALS          REVIEW OF SYSTEMS: A comprehensive ROS was conducted with the patient and is negative except as per HPI and below:  ROS     OBJECTIVE:  VS: LMP  (LMP Unknown)    Wt Readings from Last 3 Encounters:  07/09/22 169 lb (76.7 kg)  05/09/22 170 lb (77.1 kg)  03/29/22 168 lb (76.2 kg)     EXAM: General: Pt appears well and is in NAD  Eyes: External eye exam normal without stare, lid lag or exophthalmos.  EOM intact.  PERRL.  Neck: General:  Supple without adenopathy. Thyroid: Thyroid size normal.  No goiter or nodules appreciated. No thyroid bruit.  Lungs: Clear with good BS bilat with no rales, rhonchi, or wheezes  Heart: Auscultation: RRR.  Abdomen: Normoactive bowel sounds, soft, nontender, without masses or organomegaly palpable  Extremities:  BL LE: No pretibial edema normal ROM and strength.  Mental Status: Judgment, insight: Intact Orientation: Oriented to time, place, and person Mood and affect: No depression, anxiety, or agitation     DATA REVIEWED: ***    ASSESSMENT/PLAN/RECOMMENDATIONS:   ***    Medications :  Signed electronically by: Mack Guise, MD  [facility]  [address] [place], [place] [address] Phone:  [phone] FAX: [phone]   CC: Flossie Buffy, NP [place] [address] Phone: [phone] Fax: [phone]   Return to Endocrinology clinic as below: Future Appointments  Date Time Provider [place]  07/24/2022 11:30 AM Pancho Rushing, Melanie Crazier, MD [facility] None  02/01/2023  9:00 AM Nche, Charlene Brooke, NP [facility]

## 2022-07-24 ENCOUNTER — Encounter: Payer: Self-pay | Admitting: Internal Medicine

## 2022-07-24 ENCOUNTER — Ambulatory Visit (INDEPENDENT_AMBULATORY_CARE_PROVIDER_SITE_OTHER): Payer: PPO | Admitting: Internal Medicine

## 2022-07-24 VITALS — BP 112/68 | HR 70 | Ht 59.0 in | Wt 169.0 lb

## 2022-07-24 DIAGNOSIS — E0590 Thyrotoxicosis, unspecified without thyrotoxic crisis or storm: Secondary | ICD-10-CM

## 2022-07-24 DIAGNOSIS — E042 Nontoxic multinodular goiter: Secondary | ICD-10-CM | POA: Diagnosis not present

## 2022-07-24 LAB — T4, FREE: Free T4: 0.88 ng/dL (ref 0.60–1.60)

## 2022-07-24 LAB — TSH: TSH: 3.14 u[IU]/mL (ref 0.35–5.50)

## 2022-07-25 LAB — T3: T3, Total: 126 ng/dL (ref 76–181)

## 2022-07-25 MED ORDER — METHIMAZOLE 10 MG PO TABS: 15.0000 mg | ORAL_TABLET | Freq: Every day | ORAL | 2 refills | Status: DC

## 2022-08-02 ENCOUNTER — Other Ambulatory Visit: Payer: Self-pay | Admitting: Gastroenterology

## 2022-08-09 ENCOUNTER — Ambulatory Visit
Admission: RE | Admit: 2022-08-09 | Discharge: 2022-08-09 | Disposition: A | Discharge status: Home / Self Care | Payer: PPO | Source: Ambulatory Visit | Attending: Internal Medicine | Admitting: Internal Medicine

## 2022-08-09 DIAGNOSIS — E0590 Thyrotoxicosis, unspecified without thyrotoxic crisis or storm: Secondary | ICD-10-CM

## 2022-08-10 ENCOUNTER — Telehealth: Payer: Self-pay | Admitting: Internal Medicine

## 2022-08-10 NOTE — Telephone Encounter (Signed)
Please let the patient know that her thyroid ultrasound continues to show a large thyroid nodule that needs to be biopsied    She has history of biopsies in the past so she should be familiar with the process    Please see if she is okay with me ordering of another biopsy on her nodule?   Thanks

## 2022-08-10 NOTE — Telephone Encounter (Signed)
LMTCB and mychart message sent

## 2022-08-13 NOTE — Telephone Encounter (Signed)
Patient would like to do biopsy?

## 2022-08-14 ENCOUNTER — Other Ambulatory Visit: Payer: Self-pay | Admitting: Internal Medicine

## 2022-08-14 DIAGNOSIS — E042 Nontoxic multinodular goiter: Secondary | ICD-10-CM

## 2022-10-04 ENCOUNTER — Other Ambulatory Visit: Payer: PPO

## 2022-10-04 ENCOUNTER — Ambulatory Visit
Admission: RE | Admit: 2022-10-04 | Discharge: 2022-10-04 | Disposition: A | Discharge status: Home / Self Care | Payer: PPO | Source: Ambulatory Visit | Attending: Internal Medicine | Admitting: Internal Medicine

## 2022-10-04 ENCOUNTER — Other Ambulatory Visit (HOSPITAL_COMMUNITY)
Admission: RE | Admit: 2022-10-04 | Discharge: 2022-10-04 | Disposition: A | Discharge status: Home / Self Care | Payer: PPO | Source: Ambulatory Visit | Attending: Internal Medicine | Admitting: Internal Medicine

## 2022-10-04 DIAGNOSIS — E042 Nontoxic multinodular goiter: Secondary | ICD-10-CM | POA: Insufficient documentation

## 2022-10-04 DIAGNOSIS — E041 Nontoxic single thyroid nodule: Secondary | ICD-10-CM

## 2022-10-09 LAB — CYTOLOGY - NON PAP

## 2022-11-20 ENCOUNTER — Telehealth: Payer: Self-pay | Admitting: Nurse Practitioner

## 2022-11-20 NOTE — Telephone Encounter (Signed)
Called patient to schedule Medicare Annual Wellness Visit (AWV). Left message for patient to call back and schedule Medicare Annual Wellness Visit (AWV).  Last date of AWV: due   awvi 07/01/22 per [facility]   Please schedule an appointment at any time with [facility]   If any questions, please contact me at [phone]  Thank you ,  Barkley Boards AWV direct phone # [phone]

## 2022-12-18 ENCOUNTER — Ambulatory Visit: Payer: PPO | Admitting: Nurse Practitioner

## 2022-12-18 ENCOUNTER — Ambulatory Visit (INDEPENDENT_AMBULATORY_CARE_PROVIDER_SITE_OTHER): Payer: PPO | Admitting: Nurse Practitioner

## 2022-12-18 ENCOUNTER — Encounter: Payer: Self-pay | Admitting: Nurse Practitioner

## 2022-12-18 VITALS — BP 130/80 | HR 82 | Temp 98.2°F | Resp 16 | Ht 59.0 in | Wt 170.2 lb

## 2022-12-18 DIAGNOSIS — R202 Paresthesia of skin: Secondary | ICD-10-CM | POA: Diagnosis not present

## 2022-12-18 DIAGNOSIS — M79602 Pain in left arm: Secondary | ICD-10-CM

## 2022-12-18 DIAGNOSIS — F5101 Primary insomnia: Secondary | ICD-10-CM | POA: Diagnosis not present

## 2022-12-18 DIAGNOSIS — M79601 Pain in right arm: Secondary | ICD-10-CM | POA: Diagnosis not present

## 2022-12-18 NOTE — Progress Notes (Signed)
Established Patient Visit  Patient: Michelle Valenzuela   DOB: 1951/02/05   72 y.o. Female  MRN: [id number] Visit Date: 12/18/2022  Subjective:    Chief Complaint  Patient presents with   Insomnia    Pt is not sleeping, has night sweats and numbness in hands and feet.   Referral to optamology   Spanish interpreter present during visit.  Primary insomnia Onset 1year ago., worse in last 26months, associated with nigh sweats, reports Difficulty falling asleep and staying asleep, can take up to 2hrs to fall asleep. Occassional takes 22mins to fall asleep,Bedtime at 10pm, wakes up about 2pm and unable to return to sleep No TV in bedtime No OTC med used Nap: no  Snoring: no Coughing or choking: no  Caffeine:no No upper respiratory symptoms, no recent travel Wt Readings from Last 3 Encounters:  12/18/22 170 lb 3.2 oz (77.2 kg)  07/24/22 169 lb (76.7 kg)  07/09/22 169 lb (76.7 kg)    Advised to try melatonin 5-10mg  at hs F//up in 2weeks  Paresthesia and pain of extremity Chronic, intermittent, in hands and feet, worse at bedtime no matter sleep position. No discomfort with walking or lifting or bending. No swelling, no joint stiffness. No previous back or neck injury No tobacco use. No ETOH use  Normal vibration sensation Diminished microfilament sensation (bilateral feet) No edema Normal pedal pulses Check B12, thyroid panel, cbc, cmp, hgbA1c, and iron Consider lumbar and cervical spine x-ray if normal lab results  Wt Readings from Last 3 Encounters:  12/18/22 170 lb 3.2 oz (77.2 kg)  07/24/22 169 lb (76.7 kg)  07/09/22 169 lb (76.7 kg)    Reviewed medical, surgical, and social history today  Medications: Outpatient Medications Prior to Visit  Medication Sig   methimazole (TAPAZOLE) 10 MG tablet Take 1.5 tablets (15 mg total) by mouth daily.   pantoprazole (PROTONIX) 40 MG tablet Take 1 tablet (40 mg total) by mouth 2 (two) times daily before a meal.    [DISCONTINUED] aspirin EC 81 MG tablet Take 81 mg by mouth daily. Swallow whole. (Patient not taking: Reported on 05/09/2022)   [DISCONTINUED] sucralfate (CARAFATE) 1 g tablet TAKE 1 TABLET(1 GRAM) BY MOUTH FOUR TIMES DAILY AT BEDTIME WITH MEALS (Patient not taking: Reported on 12/18/2022)   No facility-administered medications prior to visit.   Reviewed past medical and social history.   ROS per HPI above      Objective:  BP 130/80 (BP Location: Left Arm, Patient Position: Sitting, Cuff Size: Normal)   Pulse 82   Temp 98.2 F (36.8 C) (Oral)   Resp 16   Ht 4\' 11"  (1.499 m)   Wt 170 lb 3.2 oz (77.2 kg)   LMP  (LMP Unknown)   SpO2 99%   BMI 34.38 kg/m      Physical Exam Cardiovascular:     Rate and Rhythm: Normal rate and regular rhythm.     Pulses: Normal pulses.          Dorsalis pedis pulses are 2+ on the right side and 2+ on the left side.       Posterior tibial pulses are 2+ on the right side and 2+ on the left side.     Heart sounds: Normal heart sounds.  Pulmonary:     Effort: Pulmonary effort is normal.     Breath sounds: Normal breath sounds.  Musculoskeletal:     Right shoulder:  Normal.     Left shoulder: Normal.     Right upper arm: Normal.     Left upper arm: Normal.     Right elbow: Normal.     Left elbow: Normal.     Right forearm: Normal.     Left forearm: Normal.     Right wrist: Normal.     Left wrist: Normal.     Right hand: Normal.     Left hand: Normal.     Cervical back: Normal.     Thoracic back: Normal.     Lumbar back: Normal.     Right hip: Tenderness present. No deformity, bony tenderness or crepitus. Normal range of motion. Normal strength.     Left hip: Tenderness present. No deformity, bony tenderness or crepitus. Normal range of motion. Normal strength.     Right upper leg: Normal.     Left upper leg: Normal.     Right knee: Normal.     Left knee: Normal.     Right lower leg: Normal. No edema.     Left lower leg: Normal. No  edema.     Right ankle: Normal.     Left ankle: Normal.     Right foot: Normal. Normal range of motion. No bunion or prominent metatarsal heads.     Left foot: Normal. Normal range of motion. No bunion or prominent metatarsal heads.     Comments: Unable to assess cap refill due to hypertrophic nails  Feet:     Right foot:     Protective Sensation: 6 sites tested.  4 sites sensed.     Skin integrity: Skin integrity normal.     Toenail Condition: Right toenails are abnormally thick. Fungal disease present.    Left foot:     Protective Sensation: 6 sites tested.  4 sites sensed.     Skin integrity: Skin integrity normal.     Toenail Condition: Left toenails are abnormally thick. Fungal disease present. Neurological:     Mental Status: She is alert and oriented to person, place, and time.     Motor: Motor function is intact.     Gait: Gait is intact.     Deep Tendon Reflexes: Babinski sign absent on the right side. Babinski sign absent on the left side.     Reflex Scores:      Bicep reflexes are 2+ on the right side and 2+ on the left side.      Brachioradialis reflexes are 2+ on the right side and 2+ on the left side.      Patellar reflexes are 2+ on the right side and 2+ on the left side.    No results found for any visits on 12/18/22.    Assessment & Plan:    Problem List Items Addressed This Visit       Other   Paresthesia and pain of extremity    Chronic, intermittent, in hands and feet, worse at bedtime no matter sleep position. No discomfort with walking or lifting or bending. No swelling, no joint stiffness. No previous back or neck injury No tobacco use. No ETOH use  Normal vibration sensation Diminished microfilament sensation (bilateral feet) No edema Normal pedal pulses Check B12, thyroid panel, cbc, cmp, hgbA1c, and iron Consider lumbar and cervical spine x-ray if normal lab results      Primary insomnia - Primary    Onset 1year ago., worse in last 59months,  associated with nigh sweats, reports Difficulty falling asleep and  staying asleep, can take up to 2hrs to fall asleep. Occassional takes 17mins to fall asleep,Bedtime at 10pm, wakes up about 2pm and unable to return to sleep No TV in bedtime No OTC med used Nap: no  Snoring: no Coughing or choking: no  Caffeine:no No upper respiratory symptoms, no recent travel Wt Readings from Last 3 Encounters:  12/18/22 170 lb 3.2 oz (77.2 kg)  07/24/22 169 lb (76.7 kg)  07/09/22 169 lb (76.7 kg)    Advised to try melatonin 5-10mg  at hs F//up in 2weeks      Relevant Orders   TSH   T4, free   T3, free   Other Visit Diagnoses     Paresthesia and pain of both upper extremities       Relevant Orders   Hemoglobin A1c   Iron, TIBC and Ferritin Panel   CBC   B12   Comprehensive metabolic panel   TSH   T4, free   T3, free      Return in about 2 weeks (around 01/01/2023) for foot fungus and insomnia.     Wilfred Lacy, NP

## 2022-12-18 NOTE — Patient Instructions (Signed)
Try melatonin 5-10mg  at bedtime for sleep Go to lab If normal, you will need x-ray of neck and back

## 2022-12-18 NOTE — Assessment & Plan Note (Addendum)
Onset 1year ago., worse in last 31months, associated with nigh sweats, reports Difficulty falling asleep and staying asleep, can take up to 2hrs to fall asleep. Occassional takes 59mins to fall asleep,Bedtime at 10pm, wakes up about 2pm and unable to return to sleep No TV in bedtime No OTC med used Nap: no  Snoring: no Coughing or choking: no  Caffeine:no No upper respiratory symptoms, no recent travel Wt Readings from Last 3 Encounters:  12/18/22 170 lb 3.2 oz (77.2 kg)  07/24/22 169 lb (76.7 kg)  07/09/22 169 lb (76.7 kg)    Advised to try melatonin 5-10mg  at hs F//up in 2weeks

## 2022-12-18 NOTE — Assessment & Plan Note (Addendum)
Chronic, intermittent, in hands and feet, worse at bedtime no matter sleep position. No discomfort with walking or lifting or bending. No swelling, no joint stiffness. No previous back or neck injury No tobacco use. No ETOH use  Normal vibration sensation Diminished microfilament sensation (bilateral feet) No edema Normal pedal pulses Check B12, thyroid panel, cbc, cmp, hgbA1c, and iron Consider lumbar and cervical spine x-ray if normal lab results

## 2023-01-01 ENCOUNTER — Ambulatory Visit (INDEPENDENT_AMBULATORY_CARE_PROVIDER_SITE_OTHER): Payer: PPO | Admitting: Nurse Practitioner

## 2023-01-01 ENCOUNTER — Encounter: Payer: Self-pay | Admitting: Nurse Practitioner

## 2023-01-01 VITALS — BP 118/78 | HR 86 | Temp 98.7°F | Resp 16 | Ht 59.0 in | Wt 168.2 lb

## 2023-01-01 DIAGNOSIS — M25511 Pain in right shoulder: Secondary | ICD-10-CM | POA: Diagnosis not present

## 2023-01-01 DIAGNOSIS — R739 Hyperglycemia, unspecified: Secondary | ICD-10-CM | POA: Diagnosis not present

## 2023-01-01 DIAGNOSIS — R300 Dysuria: Secondary | ICD-10-CM | POA: Diagnosis not present

## 2023-01-01 DIAGNOSIS — E781 Pure hyperglyceridemia: Secondary | ICD-10-CM | POA: Diagnosis not present

## 2023-01-01 DIAGNOSIS — B351 Tinea unguium: Secondary | ICD-10-CM

## 2023-01-01 DIAGNOSIS — E0590 Thyrotoxicosis, unspecified without thyrotoxic crisis or storm: Secondary | ICD-10-CM

## 2023-01-01 DIAGNOSIS — M25552 Pain in left hip: Secondary | ICD-10-CM

## 2023-01-01 DIAGNOSIS — G8929 Other chronic pain: Secondary | ICD-10-CM

## 2023-01-01 DIAGNOSIS — R202 Paresthesia of skin: Secondary | ICD-10-CM | POA: Diagnosis not present

## 2023-01-01 DIAGNOSIS — H5711 Ocular pain, right eye: Secondary | ICD-10-CM | POA: Diagnosis not present

## 2023-01-01 DIAGNOSIS — F5101 Primary insomnia: Secondary | ICD-10-CM

## 2023-01-01 DIAGNOSIS — M25512 Pain in left shoulder: Secondary | ICD-10-CM

## 2023-01-01 DIAGNOSIS — M25551 Pain in right hip: Secondary | ICD-10-CM | POA: Diagnosis not present

## 2023-01-01 DIAGNOSIS — M79609 Pain in unspecified limb: Secondary | ICD-10-CM | POA: Diagnosis not present

## 2023-01-01 LAB — POCT URINALYSIS DIPSTICK
Bilirubin, UA: NEGATIVE
Blood, UA: NEGATIVE
Glucose, UA: NEGATIVE
Ketones, UA: NEGATIVE
Nitrite, UA: NEGATIVE
Protein, UA: NEGATIVE
Spec Grav, UA: 1.025 (ref 1.010–1.025)
Urobilinogen, UA: 0.2 E.U./dL
pH, UA: 6 (ref 5.0–8.0)

## 2023-01-01 LAB — CBC
HCT: 42.9 % (ref 36.0–46.0)
Hemoglobin: 14.5 g/dL (ref 12.0–15.0)
MCHC: 33.7 g/dL (ref 30.0–36.0)
MCV: 89.9 fl (ref 78.0–100.0)
Platelets: 252 10*3/uL (ref 150.0–400.0)
RBC: 4.77 Mil/uL (ref 3.87–5.11)
RDW: 13.1 % (ref 11.5–15.5)
WBC: 6.8 10*3/uL (ref 4.0–10.5)

## 2023-01-01 LAB — COMPREHENSIVE METABOLIC PANEL
ALT: 11 U/L (ref 0–35)
AST: 14 U/L (ref 0–37)
Albumin: 4.2 g/dL (ref 3.5–5.2)
Alkaline Phosphatase: 104 U/L (ref 39–117)
BUN: 14 mg/dL (ref 6–23)
CO2: 26 mEq/L (ref 19–32)
Calcium: 9.3 mg/dL (ref 8.4–10.5)
Chloride: 106 mEq/L (ref 96–112)
Creatinine, Ser: 0.8 mg/dL (ref 0.40–1.20)
GFR: 73.52 mL/min (ref >60.00)
Glucose, Bld: 99 mg/dL (ref 70–99)
Potassium: 4.3 mEq/L (ref 3.5–5.1)
Sodium: 139 mEq/L (ref 135–145)
Total Bilirubin: 0.9 mg/dL (ref 0.2–1.2)
Total Protein: 6.8 g/dL (ref 6.0–8.3)

## 2023-01-01 LAB — CK: Total CK: 78 U/L (ref 7–177)

## 2023-01-01 LAB — LIPID PANEL
Cholesterol: 172 mg/dL (ref 0–200)
HDL: 43.1 mg/dL (ref >39.00)
LDL Cholesterol: 90 mg/dL (ref 0–99)
NonHDL: 128.76
Total CHOL/HDL Ratio: 4
Triglycerides: 195 mg/dL — ABNORMAL HIGH (ref 0.0–149.0)
VLDL: 39 mg/dL (ref 0.0–40.0)

## 2023-01-01 LAB — T3, FREE: T3, Free: 3.2 pg/mL (ref 2.3–4.2)

## 2023-01-01 LAB — T4, FREE: Free T4: 0.79 ng/dL (ref 0.60–1.60)

## 2023-01-01 LAB — VITAMIN B12: Vitamin B-12: 589 pg/mL (ref 211–911)

## 2023-01-01 LAB — C-REACTIVE PROTEIN: CRP: <1 mg/dL (ref 0.5–20.0)

## 2023-01-01 LAB — SEDIMENTATION RATE: Sed Rate: 22 mm/hr (ref 0–30)

## 2023-01-01 LAB — TSH: TSH: 4.14 u[IU]/mL (ref 0.35–5.50)

## 2023-01-01 LAB — HEMOGLOBIN A1C: Hgb A1c MFr Bld: 5.7 % (ref 4.6–6.5)

## 2023-01-01 NOTE — Assessment & Plan Note (Signed)
Eye pain, right, onset several month ago Sharp and burning sensation, intermittent, no change in vision Last eye exam by optometrist in 2022 ([facility]), use of corrective lens  Entered referral to opthalmology

## 2023-01-01 NOTE — Progress Notes (Signed)
Established Patient Visit  Patient: Michelle Valenzuela   DOB: 15-Dec-1950   72 y.o. Female  MRN: [id number] Visit Date: 01/01/2023  Subjective:    Chief Complaint  Patient presents with   Nail Problem   Insomnia   Dysuria    Pt c/o burining when urinating. Started Saturday    Use of spanish interpreter HPI She did not not labs completed after last appt. Advised to go to lab today  Primary insomnia Improved with use of melatonin 10mg  Denies any adverse effects Able to sleep 5hrs and feels rested, no difficulty falling asleep  Onychomycosis of toenail Bilateral toes, onset 58yrs ago, associated with pain. No improvement with OTC topical agents and baking soda soaks.  We discussed use of oral antifungal and possible adverse effects. She declined to take med due to side effects. Advised to use vinegar and water soak, and topical lamisil.  Dysuria Recurrent dysuria, onset 3days ago Start she took amoxicillin x 10tabs  POCT UA: normal Sent urine for culture Advise to avoid use of oral abx prior to UA and risk of abx resistance. Advised to schedule appt with urology  Eye pain, right Eye pain, right, onset several month ago Sharp and burning sensation, intermittent, no change in vision Last eye exam by optometrist in 2022 ([facility]), use of corrective lens  Entered referral to opthalmology  Reviewed medical, surgical, and social history today  Medications: Outpatient Medications Prior to Visit  Medication Sig   methimazole (TAPAZOLE) 10 MG tablet Take 1.5 tablets (15 mg total) by mouth daily.   pantoprazole (PROTONIX) 40 MG tablet Take 1 tablet (40 mg total) by mouth 2 (two) times daily before a meal.   No facility-administered medications prior to visit.   Reviewed past medical and social history.   ROS per HPI above      Objective:  BP 118/78 (BP Location: Left Arm, Patient Position: Sitting, Cuff Size: Large)   Pulse 86   Temp 98.7 F (37.1  C) (Temporal)   Resp 16   Ht 4\' 11"  (1.499 m)   Wt 168 lb 3.2 oz (76.3 kg)   LMP  (LMP Unknown)   SpO2 98%   BMI 33.97 kg/m      Physical Exam Vitals and nursing note reviewed.  Eyes:     General: Lids are normal.        Right eye: No foreign body, discharge or hordeolum.        Left eye: No foreign body, discharge or hordeolum.     Extraocular Movements: Extraocular movements intact.     Conjunctiva/sclera: Conjunctivae normal.  Cardiovascular:     Rate and Rhythm: Normal rate.     Pulses: Normal pulses.          Dorsalis pedis pulses are 2+ on the right side and 2+ on the left side.       Posterior tibial pulses are 2+ on the right side and 2+ on the left side.  Pulmonary:     Effort: Pulmonary effort is normal.  Musculoskeletal:     Right lower leg: No edema.     Left lower leg: No edema.  Feet:     Right foot:     Skin integrity: No skin breakdown, erythema, callus, dry skin or fissure.     Toenail Condition: Right toenails are abnormally thick. Fungal disease present.    Left foot:     Skin  integrity: No skin breakdown, erythema, callus, dry skin or fissure.     Toenail Condition: Left toenails are abnormally thick. Fungal disease present. Neurological:     Mental Status: She is alert and oriented to person, place, and time.     Results for orders placed or performed in visit on 01/01/23  POCT urinalysis dipstick  Result Value Ref Range   Color, UA Yellow    Clarity, UA clear    Glucose, UA Negative Negative   Bilirubin, UA neg    Ketones, UA neg    Spec Grav, UA 1.025 1.010 - 1.025   Blood, UA neg    pH, UA 6.0 5.0 - 8.0   Protein, UA Negative Negative   Urobilinogen, UA 0.2 0.2 or 1.0 E.U./dL   Nitrite, UA neg    Leukocytes, UA Small (1+) (A) Negative   Appearance     Odor        Assessment & Plan:    Problem List Items Addressed This Visit       Endocrine   Hyperthyroidism   Relevant Orders   TSH   T4, free   T3, free     Musculoskeletal  and Integument   Onychomycosis of toenail    Bilateral toes, onset 65yrs ago, associated with pain. No improvement with OTC topical agents and baking soda soaks.  We discussed use of oral antifungal and possible adverse effects. She declined to take med due to side effects. Advised to use vinegar and water soak, and topical lamisil.        Other   Bilateral hip pain   Relevant Orders   CK   C-reactive protein   Sedimentation rate   Chronic pain of both shoulders   Relevant Orders   CK   C-reactive protein   Sedimentation rate   Dysuria - Primary    Recurrent dysuria, onset 3days ago Start she took amoxicillin x 10tabs  POCT UA: normal Sent urine for culture Advise to avoid use of oral abx prior to UA and risk of abx resistance. Advised to schedule appt with urology      Relevant Orders   POCT urinalysis dipstick (Completed)   Urine Culture   Eye pain, right    Eye pain, right, onset several month ago Sharp and burning sensation, intermittent, no change in vision Last eye exam by optometrist in 2022 ([facility]), use of corrective lens  Entered referral to opthalmology      Relevant Orders   Ambulatory referral to Ophthalmology   Hyperglycemia   Relevant Orders   Hemoglobin A1c   Hypertriglyceridemia   Relevant Orders   Lipid panel   Paresthesia and pain of extremity   Relevant Orders   Comprehensive metabolic panel   CBC   123456   Iron, TIBC and Ferritin Panel   Primary insomnia    Improved with use of melatonin 10mg  Denies any adverse effects Able to sleep 5hrs and feels rested, no difficulty falling asleep      Return in about 6 months (around 07/03/2023) for hyperlipidemia (fasting), need repeat PAP.     Wilfred Lacy, NP

## 2023-01-01 NOTE — Patient Instructions (Signed)
Continue vinegar soaks and use of topical lamisil for nail fungus You will be contacted to schedule eye appt. Continue melatonin as needed for insomnia. Go to lab

## 2023-01-01 NOTE — Assessment & Plan Note (Signed)
Improved with use of melatonin 10mg  Denies any adverse effects Able to sleep 5hrs and feels rested, no difficulty falling asleep

## 2023-01-01 NOTE — Assessment & Plan Note (Signed)
Recurrent dysuria, onset 3days ago Start she took amoxicillin x 10tabs  POCT UA: normal Sent urine for culture Advise to avoid use of oral abx prior to UA and risk of abx resistance. Advised to schedule appt with urology

## 2023-01-01 NOTE — Assessment & Plan Note (Signed)
Bilateral toes, onset 76yrs ago, associated with pain. No improvement with OTC topical agents and baking soda soaks.  We discussed use of oral antifungal and possible adverse effects. She declined to take med due to side effects. Advised to use vinegar and water soak, and topical lamisil.

## 2023-01-02 LAB — URINE CULTURE
MICRO NUMBER:: 14771946
SPECIMEN QUALITY:: ADEQUATE

## 2023-01-02 LAB — IRON,TIBC AND FERRITIN PANEL
%SAT: 30 % (calc) (ref 16–45)
Ferritin: 114 ng/mL (ref 16–288)
Iron: 90 ug/dL (ref 45–160)
TIBC: 298 mcg/dL (calc) (ref 250–450)

## 2023-01-02 NOTE — Progress Notes (Signed)
Stable Follow instructions as discussed during office visit.

## 2023-01-03 NOTE — Addendum Note (Signed)
Addended by: Wilfred Lacy L on: 01/03/2023 03:22 PM   Modules accepted: Orders

## 2023-01-09 ENCOUNTER — Telehealth: Payer: Self-pay | Admitting: Nurse Practitioner

## 2023-01-09 NOTE — Telephone Encounter (Signed)
Called patient to schedule Medicare Annual Wellness Visit (AWV). Left message for patient to call back and schedule Medicare Annual Wellness Visit (AWV).  Last date of AWV:   awvi 07/01/22 per [facility]   Please schedule an appointment at any time with [facility].  If any questions, please contact me at [phone].  Thank you ,  Rudell Cobb AWV direct phone # [phone]

## 2023-01-22 ENCOUNTER — Encounter: Payer: Self-pay | Admitting: Internal Medicine

## 2023-01-22 ENCOUNTER — Ambulatory Visit (INDEPENDENT_AMBULATORY_CARE_PROVIDER_SITE_OTHER): Payer: PPO | Admitting: Internal Medicine

## 2023-01-22 VITALS — BP 116/70 | HR 76 | Ht 59.0 in | Wt 170.0 lb

## 2023-01-22 DIAGNOSIS — E0590 Thyrotoxicosis, unspecified without thyrotoxic crisis or storm: Secondary | ICD-10-CM

## 2023-01-22 DIAGNOSIS — E042 Nontoxic multinodular goiter: Secondary | ICD-10-CM | POA: Diagnosis not present

## 2023-01-22 NOTE — Progress Notes (Signed)
Name: Michelle Valenzuela  MRN/ DOB: [id number], [birth year]    Age/ Sex: 72 y.o., female    PCP: Anne Ng, NP   Reason for Endocrinology Evaluation: Hyperthyroidism     Date of Initial Endocrinology Evaluation: 07/23/2022    HPI: [name] is a 72 y.o. female with a past medical history of Hyperthyroidism. The patient presented for initial endocrinology clinic visit on 07/23/2022 for consultative assistance with her Hyperthyroidism.   Pt has been diagnosed with hyperthyroidism in 2012 .  She has been on methimazole since her diagnosis  She has a history of MNG with FNA , this was done in [place], these records are not available.  Prior TRAb negative  She was [facility], prior to that she saw me twice in 2020, at the time I had ordered thyroid uptake and scan but this was not done   Thyroid ultrasound 08/2022 revealed  multinodular goiter, she is s/p benign FNA of the left thyroid nodule  SUBJECTIVE:    Today (01/22/23): Michelle Valenzuela is here for follow-up on hyperthyroidism.   Denies local neck swelling  Denies palpitations except in the suprasternal notch  Denies tremors  Denies diarrhea  She has occasional sore throat and voice hoarseness   Methimazole 10 mg, 1.5 tabs daily    HISTORY:  Past Medical History:  Past Medical History:  Diagnosis Date   Anxiety    Arthritis    Cataracts, bilateral    MD just watching    Hemorrhoids    HSV infection    Thyroid disease    Past Surgical History:  Past Surgical History:  Procedure Laterality Date   CHOLECYSTECTOMY N/A 06/27/2021   Procedure: LAPAROSCOPIC CHOLECYSTECTOMY;  Surgeon: Kinsinger, De Blanch, MD;  Location: [facility];  Service: General;  Laterality: N/A;   COLONOSCOPY  03/29/2022   HEMORRHOID SURGERY     OVARIAN CYST SURGERY     multiple cysts   UPPER GASTROINTESTINAL ENDOSCOPY  03/29/2022    Social History:  reports that she has never smoked.  She has never used smokeless tobacco. She reports that she does not drink alcohol and does not use drugs. Family History: family history includes Heart attack in her father and paternal grandmother; Kidney Stones in her son.   HOME MEDICATIONS: Allergies as of 01/22/2023   No Known Allergies      Medication List        Accurate as of January 22, 2023  2:37 PM. If you have any questions, ask your nurse or doctor.          methimazole 10 MG tablet Commonly known as: TAPAZOLE Take 1.5 tablets (15 mg total) by mouth daily.   pantoprazole 40 MG tablet Commonly known as: PROTONIX Take 1 tablet (40 mg total) by mouth 2 (two) times daily before a meal.   sucralfate 1 g tablet Commonly known as: CARAFATE Take 1 g by mouth 4 (four) times daily.          REVIEW OF SYSTEMS: A comprehensive ROS was conducted with the patient and is negative except as per HPI     OBJECTIVE:  VS: BP 116/70 (BP Location: Left Arm, Patient Position: Sitting, Cuff Size: Large)   Pulse 76   Ht  (1.499 m)   Wt 170 lb (77.1 kg)   LMP  (LMP Unknown)   SpO2 99%   BMI 34.34 kg/m    Wt Readings from Last 3 Encounters:  01/22/23  170 lb (77.1 kg)  01/01/23 168 lb 3.2 oz (76.3 kg)  12/18/22 170 lb 3.2 oz (77.2 kg)     EXAM: General: Pt appears well and is in NAD  Eyes: External eye exam normal without stare, lid lag or exophthalmos.  EOM intact.    Neck: General: Supple without adenopathy. Thyroid: Thyroid size normal.  No goiter or nodules appreciated. No thyroid bruit.  Lungs: Clear with good BS bilat with no rales, rhonchi, or wheezes  Heart: Auscultation: RRR.  Abdomen: Normoactive bowel sounds, soft, nontender, without masses or organomegaly palpable  Extremities:  BL LE: No pretibial edema normal ROM and strength.  Mental Status: Judgment, insight: Intact Orientation: Oriented to time, place, and person Mood and affect: No depression, anxiety, or agitation     DATA  REVIEWED:     Latest Reference Range & Units 07/24/22 11:37  TSH 0.35 - 5.50 uIU/mL 3.14  Triiodothyronine (T3) 76 - 181 ng/dL 161  W9,UEAV(WUJWJX) 9.14 - 1.60 ng/dL 7.82    Thyroid ultrasound 08/09/2022  Estimated total number of nodules >/= 1 cm: 1   Number of spongiform nodules >/=  2 cm not described below (TR1): 0   Number of mixed cystic and solid nodules >/= 1.5 cm not described below (TR2): 0   _________________________________________________________   There is a 0.7 cm spongiform nodule in the left superior thyroid which appears benign and does not warrant additional follow-up.   Nodule # 2:   Location: Left; Inferior   Maximum size: 4.0 cm; Other 2 dimensions: 2.5 x 2.0 cm   Composition: solid/almost completely solid (2)   Echogenicity: isoechoic (1)   Shape: not taller-than-wide (0)   Margins: ill-defined (0)   Echogenic foci: none (0)   ACR TI-RADS total points: 3.   ACR TI-RADS risk category: TR3 (3 points).   ACR TI-RADS recommendations:   **Given size (>/= 2.5 cm) and appearance, fine needle aspiration of this mildly suspicious nodule should be considered based on TI-RADS criteria.   _________________________________________________________   No cervical lymphadenopathy.   IMPRESSION: Solid nodule at the left inferior thyroid (labeled 2, 4.0 cm) meets criteria (TI-RADS category 3) for tissue sampling. Recommend ultrasound-guided fine-needle aspiration.   FNA left nodule 10/04/2022   Clinical History: Left inferior 4.0cm; Other 2 dimensions: 2.5 x 2.0cm, Solid / almost completely solid, Isoechoic, TI-RADS total points 3 Specimen Submitted:  A. THYROID, LEFT INFERIOR, FINE NEEDLE ASPIRATION:   FINAL MICROSCOPIC DIAGNOSIS: - Consistent with benign follicular nodule (Bethesda category II) - See diagnostic comment      ASSESSMENT/PLAN/RECOMMENDATIONS:   Hyperthyroidism:  -Patient is clinically euthyroid -TRAb negative in the  past -This is most likely due to toxic thyroid nodule -TSH elevated, will reduce methimazole as below  Medications : Decrease methimazole 10 mg, 1 tablet daily   2. MNG:  -Patient endorses hoarseness as well as odynophagia -We discussed that the symptoms could be coming from her thyroid nodules or GERD -I did offer the patient the option to proceed with radiofrequency versus total thyroidectomy -She is not interested in either at this time -She endorses benign FNA of the thyroid while living in [place] years ago -She is S/P benign FNA of the left inferior thyroid nodule 1//2024   Follow-up in 6 months  Signed electronically by: Lyndle Herrlich, MD  [facility]  [facility] [address] [place], [place] [address] Phone: [phone] FAX: [phone]   CC: Anne Ng, NP [address] [place]  [place] [address] Phone: [phone] Fax: [phone]   Return to Endocrinology clinic as below: Future Appointments  Date Time Provider Department Center  01/22/2023  2:40 PM Yossi Hinchman, Konrad Dolores, MD [facility] None  02/01/2023  9:00 AM Nche, Bonna Gains, NP [facility]

## 2023-01-23 LAB — TSH: TSH: 4.94 u[IU]/mL (ref 0.35–5.50)

## 2023-01-23 LAB — T4, FREE: Free T4: 0.7 ng/dL (ref 0.60–1.60)

## 2023-01-24 ENCOUNTER — Telehealth: Payer: Self-pay | Admitting: Internal Medicine

## 2023-01-24 NOTE — Telephone Encounter (Signed)
VM left with spanish interpreter [phone] Angles  to callback for results

## 2023-01-24 NOTE — Telephone Encounter (Signed)
Can you please contact the pt and let her know that she is on too much methimazole and I need to know how much she is actually taking    When I asked her if she was on Methimazole 1.5 tablets, she initially said no but that she is taking 15 mg a day . So I explained to her that the pill is 10 mg , and taking 1.5 tabs is equivalents to 15 mg and she nodded.   But it appears that she is still on the same initial dose, can you please verify how much she is taking ( 1 tablet, 1.5 tablets or 2 )   If she is taking 1.5 tabs , need to decrease to ONE tablet    Thanks

## 2023-01-28 MED ORDER — METHIMAZOLE 10 MG PO TABS: 10.0000 mg | ORAL_TABLET | Freq: Every day | ORAL | 2 refills | Status: DC

## 2023-01-29 NOTE — Telephone Encounter (Signed)
Patient advised through mychart 

## 2023-02-01 ENCOUNTER — Encounter: Payer: Self-pay | Admitting: Nurse Practitioner

## 2023-02-01 ENCOUNTER — Ambulatory Visit (INDEPENDENT_AMBULATORY_CARE_PROVIDER_SITE_OTHER): Payer: PPO | Admitting: Nurse Practitioner

## 2023-02-01 VITALS — BP 110/72 | HR 75 | Temp 98.2°F | Resp 16 | Ht 59.0 in | Wt 169.8 lb

## 2023-02-01 DIAGNOSIS — Z0001 Encounter for general adult medical examination with abnormal findings: Secondary | ICD-10-CM

## 2023-02-01 DIAGNOSIS — R300 Dysuria: Secondary | ICD-10-CM

## 2023-02-01 DIAGNOSIS — Z780 Asymptomatic menopausal state: Secondary | ICD-10-CM | POA: Diagnosis not present

## 2023-02-01 DIAGNOSIS — Z23 Encounter for immunization: Secondary | ICD-10-CM

## 2023-02-01 DIAGNOSIS — Z1231 Encounter for screening mammogram for malignant neoplasm of breast: Secondary | ICD-10-CM

## 2023-02-01 DIAGNOSIS — R87811 Vaginal high risk human papillomavirus (HPV) DNA test positive: Secondary | ICD-10-CM

## 2023-02-01 DIAGNOSIS — R002 Palpitations: Secondary | ICD-10-CM | POA: Diagnosis not present

## 2023-02-01 NOTE — Assessment & Plan Note (Addendum)
Persistent dysuria No ETOH or caffeine or tea or soda consumption Normal urinalysis and no specific urine bacteria growth on urine culture. She reports hx of uterus prolapse Entered another referral to urogyn Advised to maintain adequate oral hydration. May use AZO OTC prn

## 2023-02-01 NOTE — Assessment & Plan Note (Signed)
Repeat PAP with HPV 2022: normal with negative HPV

## 2023-02-01 NOTE — Progress Notes (Signed)
Complete physical exam  Patient: Michelle Valenzuela   DOB: 13-Jun-1951   72 y.o. Female  MRN: [id number] Visit Date: 02/01/2023  Subjective:    Chief Complaint  Patient presents with   Annual Exam    Fasting - Yes    Use of spanish video interpreter.  Michelle Valenzuela Cardinale is a 72 y.o. female who presents today for a complete physical exam. She reports consuming a general diet.  Walking daily  She generally feels well. She reports sleeping well. She does not have additional problems to discuss today.  Vision:No Dental:No STD Screen:No  BP Readings from Last 3 Encounters:  02/01/23 110/72  01/22/23 116/70  01/01/23 118/78   Wt Readings from Last 3 Encounters:  02/01/23 169 lb 12.8 oz (77 kg)  01/22/23 170 lb (77.1 kg)  01/01/23 168 lb 3.2 oz (76.3 kg)   Most recent fall risk assessment:    12/18/2022    1:13 PM  Fall Risk   Falls in the past year? 0  Number falls in past yr: 0  Injury with Fall? 0  Risk for fall due to : No Fall Risks  Follow up Falls evaluation completed   Depression screen:Yes - No Depression  Most recent depression screenings:    12/18/2022    1:13 PM 11/02/2021   12:13 PM  PHQ 2/9 Scores  PHQ - 2 Score 1 0  PHQ- 9 Score  1   Palpitations  This is a new problem. The current episode started 1 to 4 weeks ago (2weeks). The problem has been waxing and waning. On average, each episode lasts 5 seconds. The symptoms are aggravated by unknown. Associated symptoms include chest fullness, dizziness and an irregular heartbeat. Pertinent negatives include no anxiety, chest pain, coughing, diaphoresis, fever, malaise/fatigue, nausea, near-syncope, numbness, shortness of breath, syncope, vomiting or weakness. She has tried nothing for the symptoms. Risk factors include obesity and sedentary lifestyle. Her past medical history is significant for hyperthyroidism. There is no history of anemia, anxiety, drug use, heart disease or a valve disorder.    Paplpitations and hot flashes, at night, onset 2weeks, occurs daily, last for few seconds. No coffee, no soda, no ETOH, no tea No specific trigger.  Dysuria Persistent dysuria No ETOH or caffeine or tea or soda consumption Normal urinalysis and no specific urine bacteria growth on urine culture. She reports hx of uterus prolapse Entered another referral to urogyn Advised to maintain adequate oral hydration. May use AZO OTC prn  Vaginal high risk HPV DNA test positive Repeat PAP with HPV 2022: normal with negative HPV  Past Medical History:  Diagnosis Date   Anxiety    Arthritis    Cataracts, bilateral    MD just watching    Hemorrhoids    HSV infection    Thyroid disease    Past Surgical History:  Procedure Laterality Date   CHOLECYSTECTOMY N/A 06/27/2021   Procedure: LAPAROSCOPIC CHOLECYSTECTOMY;  Surgeon: Kinsinger, De Blanch, MD;  Location: [facility];  Service: General;  Laterality: N/A;   COLONOSCOPY  03/29/2022   HEMORRHOID SURGERY     OVARIAN CYST SURGERY     multiple cysts   UPPER GASTROINTESTINAL ENDOSCOPY  03/29/2022   Social History   Socioeconomic History   Marital status: Married    Spouse name: Not on file   Number of children: 4   Years of education: Not on file   Highest education level: Bachelor's degree (e.g., BA, AB, BS)  Occupational History   Occupation:  retired  Tobacco Use   Smoking status: Never   Smokeless tobacco: Never  Vaping Use   Vaping Use: Never used  Substance and Sexual Activity   Alcohol use: Never   Drug use: Never   Sexual activity: Yes    Birth control/protection: Post-menopausal  Other Topics Concern   Not on file  Social History Narrative   Not on file   Social Determinants of Health   Financial Resource Strain: Not on file  Food Insecurity: No Food Insecurity (01/01/2023)   Hunger Vital Sign    Worried About Running Out of Food in the Last Year: Never true    Ran Out of Food in the Last Year: Never true   Transportation Needs: Unmet Transportation Needs (07/11/2021)   PRAPARE - Administrator, Civil Service (Medical): Yes    Lack of Transportation (Non-Medical): Yes  Physical Activity: Not on file  Stress: Not on file  Social Connections: Not on file  Intimate Partner Violence: Not on file   Family Status  Relation Name Status   Mother  Deceased   Father  Deceased   [facility]  (Not Specified)   Daughter  Alive   Son  Alive   Son  Alive   Son  Alive   Neg Hx  (Not Specified)   Family History  Problem Relation Age of Onset   Heart attack Father    Heart attack Paternal Grandmother    Kidney Stones Son    Diabetes Neg Hx    Hypertension Neg Hx    Colon cancer Neg Hx    Rectal cancer Neg Hx    Stomach cancer Neg Hx    Colon polyps Neg Hx    Esophageal cancer Neg Hx    No Known Allergies  Patient Care Team: Arnulfo Batson, Bonna Gains, NP as PCP - General (Internal Medicine)   Medications: Outpatient Medications Prior to Visit  Medication Sig   methimazole (TAPAZOLE) 10 MG tablet Take 1 tablet (10 mg total) by mouth daily.   pantoprazole (PROTONIX) 40 MG tablet Take 1 tablet (40 mg total) by mouth 2 (two) times daily before a meal.   sucralfate (CARAFATE) 1 g tablet Take 1 g by mouth 4 (four) times daily.   No facility-administered medications prior to visit.    Review of Systems  Constitutional:  Negative for activity change, appetite change, diaphoresis, fever, malaise/fatigue and unexpected weight change.  Respiratory: Negative.  Negative for cough and shortness of breath.   Cardiovascular:  Positive for palpitations. Negative for chest pain, syncope and near-syncope.  Gastrointestinal: Negative.  Negative for nausea and vomiting.  Endocrine: Negative for cold intolerance and heat intolerance.  Genitourinary: Negative.   Musculoskeletal: Negative.   Skin: Negative.   Neurological:  Positive for dizziness. Negative for weakness and numbness.  Hematological:  Negative.   Psychiatric/Behavioral:  Negative for behavioral problems, decreased concentration, dysphoric mood, hallucinations, self-injury, sleep disturbance and suicidal ideas. The patient is not nervous/anxious.         Objective:  BP 110/72 (BP Location: Right Arm, Patient Position: Sitting, Cuff Size: Large)   Pulse 75   Temp 98.2 F (36.8 C) (Temporal)   Resp 16   Ht 4\' 11"  (1.499 m)   Wt 169 lb 12.8 oz (77 kg)   LMP  (LMP Unknown)   SpO2 97%   BMI 34.30 kg/m     Physical Exam Vitals and nursing note reviewed.  Constitutional:      General: She is not  in acute distress. HENT:     Right Ear: Tympanic membrane, ear canal and external ear normal.     Left Ear: Tympanic membrane, ear canal and external ear normal.     Nose: Nose normal.  Eyes:     Extraocular Movements: Extraocular movements intact.     Conjunctiva/sclera: Conjunctivae normal.     Pupils: Pupils are equal, round, and reactive to light.  Neck:     Thyroid: No thyroid mass, thyromegaly or thyroid tenderness.  Cardiovascular:     Rate and Rhythm: Normal rate and regular rhythm.     Pulses: Normal pulses.     Heart sounds: Normal heart sounds.  Pulmonary:     Effort: Pulmonary effort is normal.     Breath sounds: Normal breath sounds.  Abdominal:     General: Bowel sounds are normal.     Palpations: Abdomen is soft.     Tenderness: There is no abdominal tenderness. There is no right CVA tenderness or left CVA tenderness.  Musculoskeletal:        General: Normal range of motion.     Cervical back: Normal range of motion and neck supple.     Right lower leg: No edema.     Left lower leg: No edema.  Lymphadenopathy:     Cervical: No cervical adenopathy.  Skin:    General: Skin is warm and dry.  Neurological:     Mental Status: She is alert and oriented to person, place, and time.     Cranial Nerves: No cranial nerve deficit.  Psychiatric:        Mood and Affect: Mood normal.        Behavior:  Behavior normal.        Thought Content: Thought content normal.     No results found for any visits on 02/01/23.    Assessment & Plan:    Routine Health Maintenance and Physical Exam  Immunization History  Administered Date(s) Administered   PNEUMOCOCCAL CONJUGATE-20 02/01/2023   Zoster Recombinat (Shingrix) 02/01/2023    Health Maintenance  Topic Date Due   Medicare Annual Wellness (AWV)  Never done   COVID-19 Vaccine (1) Never done   DTaP/Tdap/Td (1 - Tdap) Never done   DEXA SCAN  Never done   Zoster Vaccines- Shingrix (2 of 2) 03/29/2023   INFLUENZA VACCINE  05/02/2023   MAMMOGRAM  07/12/2023   COLONOSCOPY (Pts 45-23yrs Insurance coverage will need to be confirmed)  03/29/2029   Pneumonia Vaccine 68+ Years old  Completed   Hepatitis C Screening  Completed   HPV VACCINES  Aged Out   Discussed health benefits of physical activity, and encouraged her to engage in regular exercise appropriate for her age and condition.  Problem List Items Addressed This Visit       Other   Dysuria    Persistent dysuria No ETOH or caffeine or tea or soda consumption Normal urinalysis and no specific urine bacteria growth on urine culture. She reports hx of uterus prolapse Entered another referral to urogyn Advised to maintain adequate oral hydration. May use AZO OTC prn      Relevant Orders   Ambulatory referral to Urogynecology   Vaginal high risk HPV DNA test positive    Repeat PAP with HPV 2022: normal with negative HPV      Other Visit Diagnoses     Encounter for preventative adult health care exam with abnormal findings    -  Primary   Immunization due  Relevant Orders   Pneumococcal conjugate vaccine 20-valent (Prevnar 20) (Completed)   Zoster Recombinant (Shingrix ) (Completed)   Asymptomatic postmenopausal estrogen deficiency       Relevant Orders   DG Bone Density   Breast cancer screening by mammogram       Relevant Orders   MM 3D SCREENING MAMMOGRAM  BILATERAL BREAST   Intermittent palpitations       Relevant Orders   Holter monitor - 72 hour      Return in about 1 year (around 02/01/2024) for CPE (fasting).     Alysia Penna, NP

## 2023-02-01 NOTE — Patient Instructions (Addendum)
Use biofreeze or aspercreme for knee pain May also use tylenol or ibuprofen as needed for pain. Use AZO for dysuria You will be contacted to schedule appt with urology and for holter monitor. Schedule nurse visit for 2nd shingrix vaccine in 2months

## 2023-02-04 ENCOUNTER — Ambulatory Visit: Payer: PPO | Attending: Nurse Practitioner

## 2023-02-04 DIAGNOSIS — R002 Palpitations: Secondary | ICD-10-CM

## 2023-02-04 NOTE — Progress Notes (Unsigned)
Enrolled for [employer] to mail a ZIO XT long term holter monitor to the patients address on file.  Instruction in Spanish requested.  DOD to read.

## 2023-02-15 DIAGNOSIS — R002 Palpitations: Secondary | ICD-10-CM

## 2023-03-13 DIAGNOSIS — R300 Dysuria: Secondary | ICD-10-CM | POA: Diagnosis not present

## 2023-03-13 DIAGNOSIS — N3020 Other chronic cystitis without hematuria: Secondary | ICD-10-CM | POA: Diagnosis not present

## 2023-04-05 ENCOUNTER — Ambulatory Visit (INDEPENDENT_AMBULATORY_CARE_PROVIDER_SITE_OTHER): Payer: 59

## 2023-04-05 VITALS — Wt 170.0 lb

## 2023-04-05 DIAGNOSIS — Z0000 Encounter for general adult medical examination without abnormal findings: Secondary | ICD-10-CM

## 2023-04-05 NOTE — Patient Instructions (Signed)
[name] , Thank you for taking time to come for your Medicare Wellness Visit. I appreciate your ongoing commitment to your health goals. Please review the following plan we discussed and let me know if I can assist you in the future.   These are the goals we discussed:  Goals      Patient Stated     04/05/2023, denies any goals        This is a list of the screening recommended for you and due dates:  Health Maintenance  Topic Date Due   Medicare Annual Wellness Visit  Never done   COVID-19 Vaccine (1) Never done   DTaP/Tdap/Td vaccine (1 - Tdap) Never done   DEXA scan (bone density measurement)  Never done   Zoster (Shingles) Vaccine (2 of 2) 03/29/2023   Flu Shot  05/02/2023   Mammogram  07/12/2023   Colon Cancer Screening  03/29/2029   Pneumonia Vaccine  Completed   Hepatitis C Screening  Completed   HPV Vaccine  Aged Out    Advanced directives: Advance directive discussed with you today. Even though you declined this today please call our office should you change your mind and we can give you the proper paperwork for you to fill out.   Conditions/risks identified: none  Next appointment: Follow up in one year for your annual wellness visit    Preventive Care 65 Years and Older, Female Preventive care refers to lifestyle choices and visits with your health care provider that can promote health and wellness. What does preventive care include? A yearly physical exam. This is also called an annual well check. Dental exams once or twice a year. Routine eye exams. Ask your health care provider how often you should have your eyes checked. Personal lifestyle choices, including: Daily care of your teeth and gums. Regular physical activity. Eating a healthy diet. Avoiding tobacco and drug use. Limiting alcohol use. Practicing safe sex. Taking low-dose aspirin every day. Taking vitamin and mineral supplements as recommended by your health care provider. What happens  during an annual well check? The services and screenings done by your health care provider during your annual well check will depend on your age, overall health, lifestyle risk factors, and family history of disease. Counseling  Your health care provider may ask you questions about your: Alcohol use. Tobacco use. Drug use. Emotional well-being. Home and relationship well-being. Sexual activity. Eating habits. History of falls. Memory and ability to understand (cognition). Work and work Astronomer. Reproductive health. Screening  You may have the following tests or measurements: Height, weight, and BMI. Blood pressure. Lipid and cholesterol levels. These may be checked every 5 years, or more frequently if you are over 34 years old. Skin check. Lung cancer screening. You may have this screening every year starting at age 2 if you have a 30-pack-year history of smoking and currently smoke or have quit within the past 15 years. Fecal occult blood test (FOBT) of the stool. You may have this test every year starting at age 34. Flexible sigmoidoscopy or colonoscopy. You may have a sigmoidoscopy every 5 years or a colonoscopy every 10 years starting at age 18. Hepatitis C blood test. Hepatitis B blood test. Sexually transmitted disease (STD) testing. Diabetes screening. This is done by checking your blood sugar (glucose) after you have not eaten for a while (fasting). You may have this done every 1-3 years. Bone density scan. This is done to screen for osteoporosis. You may have this done starting  at age 9. Mammogram. This may be done every 1-2 years. Talk to your health care provider about how often you should have regular mammograms. Talk with your health care provider about your test results, treatment options, and if necessary, the need for more tests. Vaccines  Your health care provider may recommend certain vaccines, such as: Influenza vaccine. This is recommended every  year. Tetanus, diphtheria, and acellular pertussis (Tdap, Td) vaccine. You may need a Td booster every 10 years. Zoster vaccine. You may need this after age 49. Pneumococcal 13-valent conjugate (PCV13) vaccine. One dose is recommended after age 17. Pneumococcal polysaccharide (PPSV23) vaccine. One dose is recommended after age 51. Talk to your health care provider about which screenings and vaccines you need and how often you need them. This information is not intended to replace advice given to you by your health care provider. Make sure you discuss any questions you have with your health care provider. Document Released: 10/14/2015 Document Revised: 06/06/2016 Document Reviewed: 07/19/2015 Elsevier Interactive Patient Education  2017 [employer].  Fall Prevention in the Home Falls can cause injuries. They can happen to people of all ages. There are many things you can do to make your home safe and to help prevent falls. What can I do on the outside of my home? Regularly fix the edges of walkways and driveways and fix any cracks. Remove anything that might make you trip as you walk through a door, such as a raised step or threshold. Trim any bushes or trees on the path to your home. Use bright outdoor lighting. Clear any walking paths of anything that might make someone trip, such as rocks or tools. Regularly check to see if handrails are loose or broken. Make sure that both sides of any steps have handrails. Any raised decks and porches should have guardrails on the edges. Have any leaves, snow, or ice cleared regularly. Use sand or salt on walking paths during winter. Clean up any spills in your garage right away. This includes oil or grease spills. What can I do in the bathroom? Use night lights. Install grab bars by the toilet and in the tub and shower. Do not use towel bars as grab bars. Use non-skid mats or decals in the tub or shower. If you need to sit down in the shower, use a  plastic, non-slip stool. Keep the floor dry. Clean up any water that spills on the floor as soon as it happens. Remove soap buildup in the tub or shower regularly. Attach bath mats securely with double-sided non-slip rug tape. Do not have throw rugs and other things on the floor that can make you trip. What can I do in the bedroom? Use night lights. Make sure that you have a light by your bed that is easy to reach. Do not use any sheets or blankets that are too big for your bed. They should not hang down onto the floor. Have a firm chair that has side arms. You can use this for support while you get dressed. Do not have throw rugs and other things on the floor that can make you trip. What can I do in the kitchen? Clean up any spills right away. Avoid walking on wet floors. Keep items that you use a lot in easy-to-reach places. If you need to reach something above you, use a strong step stool that has a grab bar. Keep electrical cords out of the way. Do not use floor polish or wax that makes  floors slippery. If you must use wax, use non-skid floor wax. Do not have throw rugs and other things on the floor that can make you trip. What can I do with my stairs? Do not leave any items on the stairs. Make sure that there are handrails on both sides of the stairs and use them. Fix handrails that are broken or loose. Make sure that handrails are as long as the stairways. Check any carpeting to make sure that it is firmly attached to the stairs. Fix any carpet that is loose or worn. Avoid having throw rugs at the top or bottom of the stairs. If you do have throw rugs, attach them to the floor with carpet tape. Make sure that you have a light switch at the top of the stairs and the bottom of the stairs. If you do not have them, ask someone to add them for you. What else can I do to help prevent falls? Wear shoes that: Do not have high heels. Have rubber bottoms. Are comfortable and fit you  well. Are closed at the toe. Do not wear sandals. If you use a stepladder: Make sure that it is fully opened. Do not climb a closed stepladder. Make sure that both sides of the stepladder are locked into place. Ask someone to hold it for you, if possible. Clearly mark and make sure that you can see: Any grab bars or handrails. First and last steps. Where the edge of each step is. Use tools that help you move around (mobility aids) if they are needed. These include: Canes. Walkers. Scooters. Crutches. Turn on the lights when you go into a dark area. Replace any light bulbs as soon as they burn out. Set up your furniture so you have a clear path. Avoid moving your furniture around. If any of your floors are uneven, fix them. If there are any pets around you, be aware of where they are. Review your medicines with your doctor. Some medicines can make you feel dizzy. This can increase your chance of falling. Ask your doctor what other things that you can do to help prevent falls. This information is not intended to replace advice given to you by your health care provider. Make sure you discuss any questions you have with your health care provider. Document Released: 07/14/2009 Document Revised: 02/23/2016 Document Reviewed: 10/22/2014 Elsevier Interactive Patient Education  2017 [employer].

## 2023-04-05 NOTE — Progress Notes (Signed)
Subjective:   Michelle Valenzuela is a 72 y.o. female who presents for an Initial Medicare Annual Wellness Visit.  Visit Complete: Virtual  I connected with  Caprice Renshaw on 04/05/23 by a audio enabled telemedicine application and verified that I am speaking with the correct person using two identifiers. Interpreters Leory Plowman were present on call. Arna Medici first then [place] after call disconnected.  Patient Location: Home  Provider Location: Office/Clinic  I discussed the limitations of evaluation and management by telemedicine. The patient expressed understanding and agreed to proceed.    Review of Systems     Cardiac Risk Factors include: advanced age (>89men, >28 women)     Objective:    Today's Vitals   04/05/23 1326  Weight: 170 lb (77.1 kg)   Body mass index is 34.34 kg/m.     04/05/2023    1:40 PM 06/25/2021    6:06 AM  Advanced Directives  Does Patient Have a Medical Advance Directive? No No  Would patient like information on creating a medical advance directive?  No - Patient declined    Current Medications (verified) Outpatient Encounter Medications as of 04/05/2023  Medication Sig   methimazole (TAPAZOLE) 10 MG tablet Take 1 tablet (10 mg total) by mouth daily.   pantoprazole (PROTONIX) 40 MG tablet Take 1 tablet (40 mg total) by mouth 2 (two) times daily before a meal.   sucralfate (CARAFATE) 1 g tablet Take 1 g by mouth 4 (four) times daily. (Patient not taking: Reported on 04/05/2023)   No facility-administered encounter medications on file as of 04/05/2023.    Allergies (verified) Patient has no known allergies.   History: Past Medical History:  Diagnosis Date   Anxiety    Arthritis    Cataracts, bilateral    MD just watching    Hemorrhoids    HSV infection    Thyroid disease    Past Surgical History:  Procedure Laterality Date   CHOLECYSTECTOMY N/A 06/27/2021   Procedure: LAPAROSCOPIC CHOLECYSTECTOMY;  Surgeon: Kinsinger, De Blanch, MD;  Location: [facility] OR;  Service: General;  Laterality: N/A;   COLONOSCOPY  03/29/2022   HEMORRHOID SURGERY     OVARIAN CYST SURGERY     multiple cysts   UPPER GASTROINTESTINAL ENDOSCOPY  03/29/2022   Family History  Problem Relation Age of Onset   Heart attack Father    Heart attack Paternal Grandmother    Kidney Stones Son    Diabetes Neg Hx    Hypertension Neg Hx    Colon cancer Neg Hx    Rectal cancer Neg Hx    Stomach cancer Neg Hx    Colon polyps Neg Hx    Esophageal cancer Neg Hx    Social History   Socioeconomic History   Marital status: Married    Spouse name: Not on file   Number of children: 4   Years of education: Not on file   Highest education level: Bachelor's degree (e.g., BA, AB, BS)  Occupational History   Occupation: retired  Tobacco Use   Smoking status: Never   Smokeless tobacco: Never  Vaping Use   Vaping Use: Never used  Substance and Sexual Activity   Alcohol use: Never   Drug use: Never   Sexual activity: Yes    Birth control/protection: Post-menopausal  Other Topics Concern   Not on file  Social History Narrative   Not on file   Social Determinants of Health   Financial Resource Strain: Low Risk  (04/05/2023)  Overall Financial Resource Strain (CARDIA)    Difficulty of Paying Living Expenses: Not hard at all  Food Insecurity: No Food Insecurity (04/05/2023)   Hunger Vital Sign    Worried About Running Out of Food in the Last Year: Never true    Ran Out of Food in the Last Year: Never true  Transportation Needs: No Transportation Needs (04/05/2023)   PRAPARE - Administrator, Civil Service (Medical): No    Lack of Transportation (Non-Medical): No  Physical Activity: Inactive (04/05/2023)   Exercise Vital Sign    Days of Exercise per Week: 0 days    Minutes of Exercise per Session: 0 min  Stress: No Stress Concern Present (04/05/2023)   [employer] of Occupational Health - Occupational Stress Questionnaire    Feeling  of Stress : Only a little  Social Connections: Moderately Integrated (04/05/2023)   Social Connection and Isolation Panel [NHANES]    Frequency of Communication with Friends and Family: Three times a week    Frequency of Social Gatherings with Friends and Family: Once a week    Attends Religious Services: More than 4 times per year    Active Member of [employer] or Organizations: No    Attends Engineer, structural: Never    Marital Status: Married    Tobacco Counseling Counseling given: Not Answered   Clinical Intake:  Pre-visit preparation completed: Yes  Pain : No/denies pain     Nutritional Status: BMI > 30  Obese Nutritional Risks: None Diabetes: No  How often do you need to have someone help you when you read instructions, pamphlets, or other written materials from your doctor or pharmacy?: 3 - Sometimes  Interpreter Needed?: Yes Interpreter Agency: [employer] Interpreter Name: Flavia Shipper ID: [id number]  Information entered by :: NAllen LPN   Activities of Daily Living    04/05/2023    1:30 PM  In your present state of health, do you have any difficulty performing the following activities:  Hearing? 0  Vision? 0  Difficulty concentrating or making decisions? 0  Walking or climbing stairs? 0  Dressing or bathing? 0  Doing errands, shopping? 0  Preparing Food and eating ? N  Using the Toilet? N  In the past six months, have you accidently leaked urine? Y  Comment has falling bladder  Do you have problems with loss of bowel control? N  Managing your Medications? N  Managing your Finances? N  Housekeeping or managing your Housekeeping? N    Patient Care Team: Nche, Bonna Gains, NP as PCP - General (Internal Medicine)  Indicate any recent Medical Services you may have received from other than [facility] providers in the past year (date may be approximate).     Assessment:   This is a routine wellness examination for [facility].  Hearing/Vision screen Hearing Screening - Comments:: Denies hearing issues Vision Screening - Comments:: No regular eye exams,   Dietary issues and exercise activities discussed:     Goals Addressed             This Visit's Progress    Patient Stated       04/05/2023, denies any goals       Depression Screen    04/05/2023    1:44 PM 12/18/2022    1:13 PM 11/02/2021   12:13 PM 01/11/2020   10:30 AM 05/12/2018    9:15 AM 05/02/2018    3:57 PM  PHQ 2/9 Scores  PHQ - 2 Score  0 1 0 0 0 2  PHQ- 9 Score 2  1  1      Fall Risk    04/05/2023    1:42 PM 12/18/2022    1:13 PM 11/02/2021   11:36 AM 01/11/2020   10:30 AM 05/12/2018   10:16 AM  Fall Risk   Falls in the past year? 0 0 0 0 No  Number falls in past yr: 0 0 0 0   Injury with Fall? 0 0 0 0   Risk for fall due to : Medication side effect No Fall Risks No Fall Risks    Follow up Falls prevention discussed;Falls evaluation completed Falls evaluation completed Falls evaluation completed      MEDICARE RISK AT HOME:  Medicare Risk at Home - 04/05/23 1343     Any stairs in or around the home? Yes    If so, are there any without handrails? Yes    Home free of loose throw rugs in walkways, pet beds, electrical cords, etc? Yes    Adequate lighting in your home to reduce risk of falls? Yes    Life alert? No    Use of a cane, walker or w/c? No    Grab bars in the bathroom? No    Shower chair or bench in shower? No    Elevated toilet seat or a handicapped toilet? No             TIMED UP AND GO:  Was the test performed? No    Cognitive Function:  6 CIT was not administered due to language barrier, Appeared via conversations through interpreters.        Immunizations Immunization History  Administered Date(s) Administered   PNEUMOCOCCAL CONJUGATE-20 02/01/2023   Zoster Recombinant(Shingrix) 02/01/2023    TDAP status: Due, Education has been provided regarding the importance of this vaccine. Advised may  receive this vaccine at local pharmacy or Health Dept. Aware to provide a copy of the vaccination record if obtained from local pharmacy or Health Dept. Verbalized acceptance and understanding.  Flu Vaccine status: Declined, Education has been provided regarding the importance of this vaccine but patient still declined. Advised may receive this vaccine at local pharmacy or Health Dept. Aware to provide a copy of the vaccination record if obtained from local pharmacy or Health Dept. Verbalized acceptance and understanding.  Pneumococcal vaccine status: Up to date  Covid-19 vaccine status: Declined, Education has been provided regarding the importance of this vaccine but patient still declined. Advised may receive this vaccine at local pharmacy or Health Dept.or vaccine clinic. Aware to provide a copy of the vaccination record if obtained from local pharmacy or Health Dept. Verbalized acceptance and understanding.  Qualifies for Shingles Vaccine? Yes   Zostavax completed No   Shingrix Completed?: needs second dose  Screening Tests Health Maintenance  Topic Date Due   Medicare Annual Wellness (AWV)  Never done   COVID-19 Vaccine (1) Never done   DTaP/Tdap/Td (1 - Tdap) Never done   DEXA SCAN  Never done   Zoster Vaccines- Shingrix (2 of 2) 03/29/2023   INFLUENZA VACCINE  05/02/2023   MAMMOGRAM  07/12/2023   Colonoscopy  03/29/2029   Pneumonia Vaccine 35+ Years old  Completed   Hepatitis C Screening  Completed   HPV VACCINES  Aged Out    Health Maintenance  Health Maintenance Due  Topic Date Due   Medicare Annual Wellness (AWV)  Never done   COVID-19 Vaccine (1) Never done   DTaP/Tdap/Td (  1 - Tdap) Never done   DEXA SCAN  Never done   Zoster Vaccines- Shingrix (2 of 2) 03/29/2023    Colorectal cancer screening: Type of screening: Colonoscopy. Completed 03/29/2022. Repeat every 7 years  Mammogram status: Ordered 02/01/2023. Pt provided with contact info and advised to call to  schedule appt.   Bone Density status: Ordered 02/01/2023. Pt provided with contact info and advised to call to schedule appt.  Lung Cancer Screening: (Low Dose CT Chest recommended if Age 66-80 years, 20 pack-year currently smoking OR have quit w/in 15years.) does not qualify.   Lung Cancer Screening Referral: no  Additional Screening:  Hepatitis C Screening: does qualify; Completed 05/02/2018  Vision Screening: Recommended annual ophthalmology exams for early detection of glaucoma and other disorders of the eye. Is the patient up to date with their annual eye exam?  No  Who is the provider or what is the name of the office in which the patient attends annual eye exams? none If pt is not established with a provider, would they like to be referred to a provider to establish care? No .   Dental Screening: Recommended annual dental exams for proper oral hygiene  Diabetic Foot Exam: n/a  Community Resource Referral / Chronic Care Management: CRR required this visit?  No   CCM required this visit?  No     Plan:     I have personally reviewed and noted the following in the patient's chart:   Medical and social history Use of alcohol, tobacco or illicit drugs  Current medications and supplements including opioid prescriptions. Patient is not currently taking opioid prescriptions. Functional ability and status Nutritional status Physical activity Advanced directives List of other physicians Hospitalizations, surgeries, and ER visits in previous 12 months Vitals Screenings to include cognitive, depression, and falls Referrals and appointments  In addition, I have reviewed and discussed with patient certain preventive protocols, quality metrics, and best practice recommendations. A written personalized care plan for preventive services as well as general preventive health recommendations were provided to patient.     Barb Merino, LPN   10/06/1094   After Visit Summary:  (MyChart) Due to this being a telephonic visit, the after visit summary with patients personalized plan was offered to patient via MyChart   Nurse Notes: none

## 2023-04-12 ENCOUNTER — Ambulatory Visit: Payer: 59

## 2023-04-30 DIAGNOSIS — N390 Urinary tract infection, site not specified: Secondary | ICD-10-CM | POA: Diagnosis not present

## 2023-06-04 ENCOUNTER — Ambulatory Visit: Payer: PPO | Admitting: Obstetrics and Gynecology

## 2023-06-12 DIAGNOSIS — N3020 Other chronic cystitis without hematuria: Secondary | ICD-10-CM | POA: Diagnosis not present

## 2023-06-12 DIAGNOSIS — R300 Dysuria: Secondary | ICD-10-CM | POA: Diagnosis not present

## 2023-07-03 ENCOUNTER — Ambulatory Visit (INDEPENDENT_AMBULATORY_CARE_PROVIDER_SITE_OTHER): Payer: 59 | Admitting: Obstetrics and Gynecology

## 2023-07-03 ENCOUNTER — Encounter: Payer: Self-pay | Admitting: Obstetrics and Gynecology

## 2023-07-03 ENCOUNTER — Other Ambulatory Visit (HOSPITAL_COMMUNITY)
Admission: RE | Admit: 2023-07-03 | Discharge: 2023-07-03 | Disposition: A | Discharge status: Home / Self Care | Payer: 59 | Source: Other Acute Inpatient Hospital | Attending: Obstetrics and Gynecology | Admitting: Obstetrics and Gynecology

## 2023-07-03 VITALS — BP 136/84 | HR 91 | Ht 60.08 in | Wt 169.2 lb

## 2023-07-03 DIAGNOSIS — R350 Frequency of micturition: Secondary | ICD-10-CM

## 2023-07-03 DIAGNOSIS — N390 Urinary tract infection, site not specified: Secondary | ICD-10-CM | POA: Insufficient documentation

## 2023-07-03 DIAGNOSIS — N816 Rectocele: Secondary | ICD-10-CM | POA: Diagnosis not present

## 2023-07-03 DIAGNOSIS — N8110 Cystocele, unspecified: Secondary | ICD-10-CM

## 2023-07-03 DIAGNOSIS — N952 Postmenopausal atrophic vaginitis: Secondary | ICD-10-CM

## 2023-07-03 LAB — POCT URINALYSIS DIPSTICK
Appearance: NEGATIVE
Blood, UA: NEGATIVE
Glucose, UA: NEGATIVE
Nitrite, UA: NEGATIVE
Protein, UA: NEGATIVE
Spec Grav, UA: >=1.03 — AB (ref 1.010–1.025)
Urobilinogen, UA: 0.2 U/dL
pH, UA: 5 (ref 5.0–8.0)

## 2023-07-03 MED ORDER — SULFAMETHOXAZOLE-TRIMETHOPRIM 800-160 MG PO TABS
1.0000 | ORAL_TABLET | Freq: Two times a day (BID) | ORAL | 0 refills | Status: AC
Start: 2023-07-03 — End: 2023-07-06

## 2023-07-03 MED ORDER — ESTRADIOL 0.1 MG/GM VA CREA
TOPICAL_CREAM | VAGINAL | 11 refills | Status: DC
Start: 2023-07-04 — End: 2023-08-07

## 2023-07-03 NOTE — Progress Notes (Signed)
New Patient Evaluation and Consultation  Referring Provider: Nche, Bonna Gains, NP PCP: Anne Ng, NP Date of Service: 07/03/2023  SUBJECTIVE Chief Complaint: New Patient (Initial Visit) (Referral from Dr Elease Etienne)  History of Present Illness: Michelle Valenzuela is a 72 y.o.  hispanic  female seen in consultation at the request of NP Nche for evaluation of dysuria.    Review of records significant for: Has symptoms of dysuria. Urine culture negative 01/01/23  Urinary Symptoms: Leaks urine with with a full bladder and with movement to the bathroom Does not leak often Pad use: none Patient is not bothered by UI symptoms.  Day time voids 5.  Nocturia: 1 times per night to void. Voiding dysfunction:  empties bladder well.  Patient does not use a catheter to empty bladder.  When urinating, patient feels she has no difficulties and the need to urinate multiple times in a row  UTIs: reports UTI's in the last year.  Currently has burning with urination which started today. She drinks cranberry juice and tablets and this helps her to relieve the pain.  She is not sure when she last took antibiotics. She has seen Dr Arita Miss at [facility] and was prescribed vaginal estrogen but no longer taking it because she ran out.  Denies history of blood in urine and kidney or bladder stones   Pelvic Organ Prolapse Symptoms:                  Patient Admits to a feeling of a bulge the vaginal area. Has been present for many years.  Patient Admits to seeing a bulge.  This bulge is bothersome.  Bowel Symptom: Bowel movements: 1 time(s) per day Stool consistency: soft  Straining: no.  Splinting: no.  Incomplete evacuation: no.  Patient Denies accidental bowel leakage / fecal incontinence Bowel regimen: none  HM Colonoscopy          Colonoscopy (Every 7 Years) Next due on 03/29/2029    03/29/2022  COLONOSCOPY   Only the first 1 history entries have been loaded, but more history  exists.            Sexual Function Sexually active: yes.  Pain with sex: No  Pelvic Pain Denies pelvic pain   Past Medical History:  Past Medical History:  Diagnosis Date   Anxiety    Arthritis    Cataracts, bilateral    MD just watching    Hemorrhoids    HSV infection    Thyroid disease      Past Surgical History:   Past Surgical History:  Procedure Laterality Date   CHOLECYSTECTOMY N/A 06/27/2021   Procedure: LAPAROSCOPIC CHOLECYSTECTOMY;  Surgeon: Kinsinger, De Blanch, MD;  Location: [facility] OR;  Service: General;  Laterality: N/A;   COLONOSCOPY  03/29/2022   HEMORRHOID SURGERY     OVARIAN CYST SURGERY     multiple cysts   UPPER GASTROINTESTINAL ENDOSCOPY  03/29/2022     Past OB/GYN History: OB History  Gravida Para Term Preterm AB Living  4         4  SAB IAB Ectopic Multiple Live Births          4    # Outcome Date GA Lbr Len/2nd Weight Sex Type Anes PTL Lv  4 Gravida           3 Gravida           2 Gravida           1 [place]  Menopausal: Denies vaginal bleeding since menopause Any history of abnormal pap smears: no.    Component Value Date/Time   DIAGPAP  07/11/2021 1112    - Negative for intraepithelial lesion or malignancy (NILM)   DIAGPAP  01/11/2020 1126    - Negative for intraepithelial lesion or malignancy (NILM)   HPVHIGH Negative 07/11/2021 1112   HPVHIGH Positive (A) 01/11/2020 1126   ADEQPAP  07/11/2021 1112    Satisfactory for evaluation; transformation zone component PRESENT.   ADEQPAP  01/11/2020 1126    Satisfactory for evaluation; transformation zone component PRESENT.    Medications: Patient has a current medication list which includes the following prescription(s): [START ON 07/04/2023] estradiol, methimazole, pantoprazole, sucralfate, and sulfamethoxazole-trimethoprim.   Allergies: Patient has No Known Allergies.   Social History:  Social History   Tobacco Use   Smoking status: Never   Smokeless tobacco:  Never  Vaping Use   Vaping status: Never Used  Substance Use Topics   Alcohol use: Never   Drug use: Never    Relationship status: married Patient lives with her husband.   Patient is not employed. Regular exercise: Yes:   History of abuse: No  Family History:   Family History  Problem Relation Age of Onset   Heart attack Father    Heart attack Paternal Grandmother    Kidney Stones Son    Diabetes Neg Hx    Hypertension Neg Hx    Colon cancer Neg Hx    Rectal cancer Neg Hx    Stomach cancer Neg Hx    Colon polyps Neg Hx    Esophageal cancer Neg Hx      Review of Systems: Review of Systems  Constitutional:  Negative for fever, malaise/fatigue and weight loss.  Respiratory:  Negative for cough, shortness of breath and wheezing.   Cardiovascular:  Negative for chest pain, palpitations and leg swelling.  Gastrointestinal:  Negative for abdominal pain and blood in stool.  Genitourinary:  Negative for dysuria.  Musculoskeletal:  Positive for myalgias.  Skin:  Negative for rash.  Neurological:  Positive for dizziness. Negative for headaches.  Endo/Heme/Allergies:  Does not bruise/bleed easily.  Psychiatric/Behavioral:  Negative for depression. The patient is not nervous/anxious.      OBJECTIVE Physical Exam: Vitals:   07/03/23 1545 07/03/23 1553  BP: (!) 147/81 136/84  Pulse: 91   Weight: 169 lb 3.2 oz (76.7 kg)   Height: 5' 0.08" (1.526 m)     Physical Exam Constitutional:      General: She is not in acute distress. Pulmonary:     Effort: Pulmonary effort is normal.  Abdominal:     General: There is no distension.     Palpations: Abdomen is soft.     Tenderness: There is no abdominal tenderness. There is no rebound.  Musculoskeletal:        General: No swelling. Normal range of motion.  Skin:    General: Skin is warm and dry.     Findings: No rash.  Neurological:     Mental Status: She is alert and oriented to person, place, and time.  Psychiatric:         Mood and Affect: Mood normal.        Behavior: Behavior normal.      GU / Detailed Urogynecologic Evaluation:  Pelvic Exam: Normal external female genitalia; Bartholin's and Skene's glands normal in appearance; urethral meatus normal in appearance, no urethral masses or discharge.   CST: negative  Speculum exam reveals normal  vaginal mucosa with atrophy. Cervix normal appearance. Uterus normal single, nontender. Adnexa no mass, fullness, tenderness.    Pelvic floor strength I/V  Pelvic floor musculature: Right levator non-tender, Right obturator non-tender, Left levator non-tender, Left obturator non-tender  POP-Q:   POP-Q  -1                                            Aa   -1                                           Ba  -7.5                                              C   3                                            Gh  4                                            Pb  9                                            tvl   -1                                            Ap  -1                                            Bp  -9                                              D      Rectal Exam:  Normal external rectum  Post-Void Residual (PVR) by Bladder Scan: In order to evaluate bladder emptying, we discussed obtaining a postvoid residual and patient agreed to this procedure.  Procedure: The ultrasound unit was placed on the patient's abdomen in the suprapubic region after the patient had voided.    Post Void Residual - 07/03/23 1607       Post Void Residual   Post Void Residual 4 mL              Laboratory Results: Lab Results  Component Value Date   COLORU yellow 07/03/2023   CLARITYU clear 07/03/2023   GLUCOSEUR Negative 07/03/2023   BILIRUBINUR 2+ 07/03/2023   KETONESU small 07/03/2023   SPECGRAV >=1.030 (A) 07/03/2023   RBCUR  negative 07/03/2023   PHUR 5.0 07/03/2023   PROTEINUR Negative 07/03/2023   UROBILINOGEN 0.2 07/03/2023    LEUKOCYTESUR Large (3+) (A) 07/03/2023    Lab Results  Component Value Date   CREATININE 0.80 01/01/2023   CREATININE 0.75 06/26/2021   CREATININE 0.76 06/25/2021    Lab Results  Component Value Date   HGBA1C 5.7 01/01/2023    Lab Results  Component Value Date   HGB 14.5 01/01/2023     ASSESSMENT AND PLAN Michelle Valenzuela is a 72 y.o. with:  1. Acute UTI   2. Frequency of micturition   3. Recurrent urinary tract infection   4. Vaginal atrophy   5. Prolapse of anterior vaginal wall   6. Prolapse of posterior vaginal wall    Urinary tract infection  - will send bactrim DS x3 day for leukocytes in urine. Urine will be sent for culture.  - For treatment of recurrent urinary tract infections, we discussed management of recurrent UTIs including prophylaxis with a daily low dose antibiotic, transvaginal estrogen therapy, D-mannose, and cranberry supplements.  Refilled vaginal estrogen cream and advised her to use it twice a week - When she has symptoms, then she should come to the office to provide a urine sample so we can track infections.   2. Vaginal atrophy - in addition to the estrace cream, recommended daily coconut oil for vaginal moisture  3. Stage II anterior, Stage II posterior, Stage I apical prolapse - For treatment of pelvic organ prolapse, we discussed options for management including expectant management, conservative management, and surgical management, such as Kegels, a pessary, pelvic floor physical therapy, and specific surgical procedures. - She is not bothered by her prolapse currently and does not want treatment. Handout provided regarding pessary.   Return 1 month for follow up  Marguerita Beards, MD

## 2023-07-05 LAB — URINE CULTURE

## 2023-07-23 ENCOUNTER — Ambulatory Visit: Payer: PPO | Admitting: Internal Medicine

## 2023-07-24 DIAGNOSIS — J029 Acute pharyngitis, unspecified: Secondary | ICD-10-CM | POA: Diagnosis not present

## 2023-07-24 DIAGNOSIS — R42 Dizziness and giddiness: Secondary | ICD-10-CM | POA: Diagnosis not present

## 2023-07-25 ENCOUNTER — Ambulatory Visit: Payer: 59 | Admitting: Internal Medicine

## 2023-07-25 ENCOUNTER — Encounter: Payer: Self-pay | Admitting: Internal Medicine

## 2023-07-25 VITALS — BP 128/82 | HR 89 | Ht 60.08 in | Wt 168.0 lb

## 2023-07-25 DIAGNOSIS — E0590 Thyrotoxicosis, unspecified without thyrotoxic crisis or storm: Secondary | ICD-10-CM

## 2023-07-25 DIAGNOSIS — E042 Nontoxic multinodular goiter: Secondary | ICD-10-CM

## 2023-07-25 LAB — T4, FREE: Free T4: 0.93 ng/dL (ref 0.60–1.60)

## 2023-07-25 LAB — TSH: TSH: 1.62 u[IU]/mL (ref 0.35–5.50)

## 2023-07-25 NOTE — Progress Notes (Signed)
Name: Michelle Valenzuela  MRN/ DOB: [id number], [birth year]    Age/ Sex: 72 y.o., female    PCP: Anne Ng, NP   Reason for Endocrinology Evaluation: Hyperthyroidism     Date of Initial Endocrinology Evaluation: 07/23/2022    HPI: Michelle Valenzuela is a 72 y.o. female with a past medical history of Hyperthyroidism. The patient presented for initial endocrinology clinic visit on 07/23/2022 for consultative assistance with her Hyperthyroidism.   Pt has been diagnosed with hyperthyroidism in 2012 .  She has been on methimazole since her diagnosis  She has a history of MNG with FNA , this was done in [place], these records are not available.  Prior TRAb negative  [facility], prior to that she saw me twice in 2020, at the time I had ordered thyroid uptake and scan but this was not done   Thyroid ultrasound 08/2022 revealed  multinodular goiter, she is s/p benign FNA of the left thyroid nodule  SUBJECTIVE:    Today (07/25/23): Michelle Valenzuela is here for follow-up on hyperthyroidism.   Interpreter line was used today Weight stable  Denies local neck swelling  Has occasional palpitations  Denies tremors  Has alternating diarrhea with  constipation  Has right eye lid itching but no burning     Methimazole 10 mg, 1 tabs daily     HISTORY:  Past Medical History:  Past Medical History:  Diagnosis Date   Anxiety    Arthritis    Cataracts, bilateral    MD just watching    Hemorrhoids    HSV infection    Thyroid disease    Past Surgical History:  Past Surgical History:  Procedure Laterality Date   CHOLECYSTECTOMY N/A 06/27/2021   Procedure: LAPAROSCOPIC CHOLECYSTECTOMY;  Surgeon: Kinsinger, De Blanch, MD;  Location: [facility] OR;  Service: General;  Laterality: N/A;   COLONOSCOPY  03/29/2022   HEMORRHOID SURGERY     OVARIAN CYST SURGERY     multiple cysts   UPPER GASTROINTESTINAL ENDOSCOPY  03/29/2022    Social  History:  reports that she has never smoked. She has never used smokeless tobacco. She reports that she does not drink alcohol and does not use drugs. Family History: family history includes Heart attack in her father and paternal grandmother; Kidney Stones in her son.   HOME MEDICATIONS: Allergies as of 07/25/2023   No Known Allergies      Medication List        Accurate as of July 25, 2023  6:57 AM. If you have any questions, ask your nurse or doctor.          estradiol 0.1 MG/GM vaginal cream Commonly known as: ESTRACE Place 0.5g at night two times a week   methimazole 10 MG tablet Commonly known as: TAPAZOLE Take 1 tablet (10 mg total) by mouth daily.   pantoprazole 40 MG tablet Commonly known as: PROTONIX Take 1 tablet (40 mg total) by mouth 2 (two) times daily before a meal.   sucralfate 1 g tablet Commonly known as: CARAFATE Take 1 g by mouth 4 (four) times daily.          REVIEW OF SYSTEMS: A comprehensive ROS was conducted with the patient and is negative except as per HPI     OBJECTIVE:  VS: BP 128/82 (BP Location: Left Arm, Patient Position: Sitting, Cuff Size: Small)   Pulse 89   Ht 5' 0.08" (1.526 m)   Wt  168 lb (76.2 kg)   LMP  (LMP Unknown)   SpO2 98%   BMI 32.72 kg/m     Wt Readings from Last 3 Encounters:  07/03/23 169 lb 3.2 oz (76.7 kg)  04/05/23 170 lb (77.1 kg)  02/01/23 169 lb 12.8 oz (77 kg)     EXAM: General: Pt appears well and is in NAD  Neck: General: Supple without adenopathy. Thyroid: Thyroid size normal.  No goiter or nodules appreciated. No thyroid bruit.  Lungs: Clear with good BS bilat   Heart: Auscultation: RRR.  Extremities:  BL LE: No pretibial edema normal   Mental Status: Judgment, insight: Intact Orientation: Oriented to time, place, and person Mood and affect: No depression, anxiety, or agitation     DATA REVIEWED:   Latest Reference Range & Units 07/25/23 09:53  TSH 0.35 - 5.50 uIU/mL 1.62   T4,Free(Direct) 0.60 - 1.60 ng/dL 1.61    Thyroid ultrasound 08/09/2022  Estimated total number of nodules >/= 1 cm: 1   Number of spongiform nodules >/=  2 cm not described below (TR1): 0   Number of mixed cystic and solid nodules >/= 1.5 cm not described below (TR2): 0   _________________________________________________________   There is a 0.7 cm spongiform nodule in the left superior thyroid which appears benign and does not warrant additional follow-up.   Nodule # 2:   Location: Left; Inferior   Maximum size: 4.0 cm; Other 2 dimensions: 2.5 x 2.0 cm   Composition: solid/almost completely solid (2)   Echogenicity: isoechoic (1)   Shape: not taller-than-wide (0)   Margins: ill-defined (0)   Echogenic foci: none (0)   ACR TI-RADS total points: 3.   ACR TI-RADS risk category: TR3 (3 points).   ACR TI-RADS recommendations:   **Given size (>/= 2.5 cm) and appearance, fine needle aspiration of this mildly suspicious nodule should be considered based on TI-RADS criteria.   _________________________________________________________   No cervical lymphadenopathy.   IMPRESSION: Solid nodule at the left inferior thyroid (labeled 2, 4.0 cm) meets criteria (TI-RADS category 3) for tissue sampling. Recommend ultrasound-guided fine-needle aspiration.   FNA left nodule 10/04/2022   Clinical History: Left inferior 4.0cm; Other 2 dimensions: 2.5 x 2.0cm, Solid / almost completely solid, Isoechoic, TI-RADS total points 3 Specimen Submitted:  A. THYROID, LEFT INFERIOR, FINE NEEDLE ASPIRATION:   FINAL MICROSCOPIC DIAGNOSIS: - Consistent with benign follicular nodule (Bethesda category II) - See diagnostic comment      ASSESSMENT/PLAN/RECOMMENDATIONS:   Hyperthyroidism:  -Patient is clinically euthyroid -TRAb negative in the past -This is most likely due to toxic thyroid nodule -TFTs normal, no change  Medications : Continue methimazole 10 mg, 1 tablet  daily   2. MNG:  - No local neck symptoms  -She endorses benign FNA of the thyroid while living in [place] years ago -She is S/P benign FNA of the left inferior thyroid nodule 1//2024 - Will proceed with repeat thyroid ultrasound    Follow-up in 6 months  Signed electronically by: Lyndle Herrlich, MD  [facility]  [facility] [address] [place] [place], [place] [address] Phone: [phone] FAX: [phone]   CC: Anne Ng, NP [address] [place] [address] Phone: [phone] Fax: [phone]   Return to Endocrinology clinic as below: Future Appointments  Date Time Provider Department Center  07/25/2023  9:30 AM Michelle Valenzuela, Michelle Dolores, MD [facility] None  08/07/2023  3:20 PM Selmer Dominion, NP [facility]  10/11/2023  1:00  PM [facility] DX DEXA 1 [facility] CE  04/06/2024  1:30 PM [facility]-ANNUAL WELLNESS VISIT [facility]

## 2023-07-26 ENCOUNTER — Encounter: Payer: Self-pay | Admitting: Internal Medicine

## 2023-07-26 MED ORDER — METHIMAZOLE 10 MG PO TABS
10.0000 mg | ORAL_TABLET | Freq: Every day | ORAL | 3 refills | Status: DC
Start: 2023-07-26 — End: 2024-01-15

## 2023-08-05 IMAGING — US US RENAL
1 series · 14 of 25 positions shown · non-contrast
Comparison: Abdomen pelvis 06/25/2021

CLINICAL DATA: Dysuria

EXAM:
RENAL / URINARY TRACT ULTRASOUND COMPLETE

[Series 1: us renal · 14 of 37 slices shown]
[im 1/37]
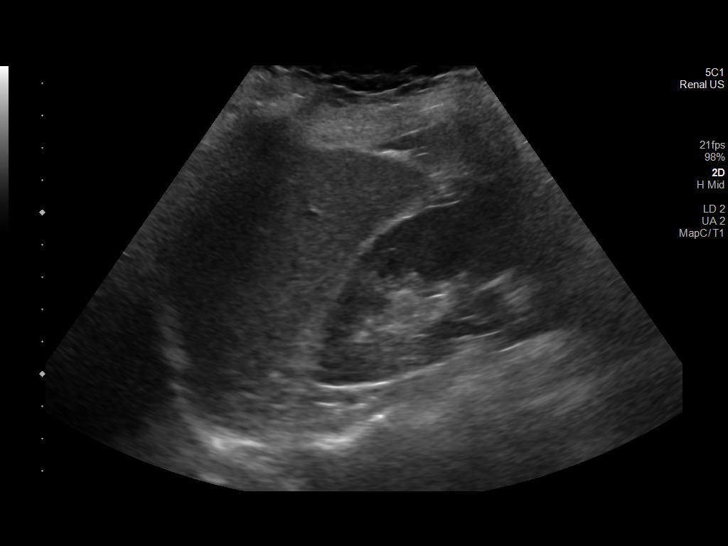
[im 4/37]
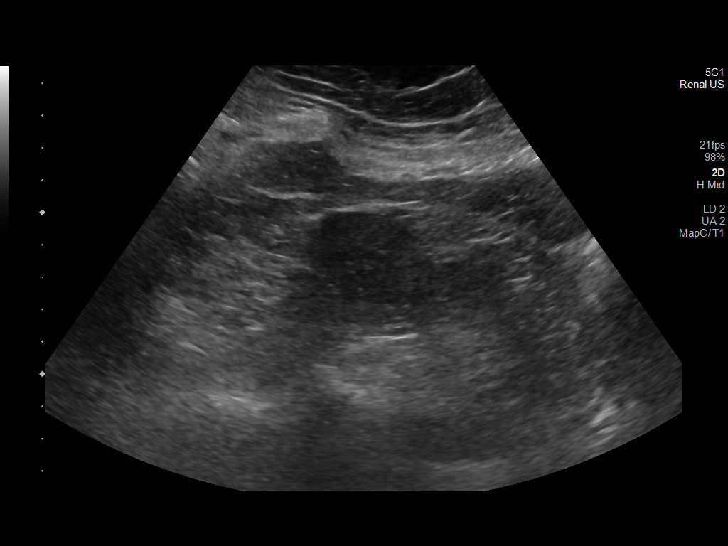
[im 7/37]
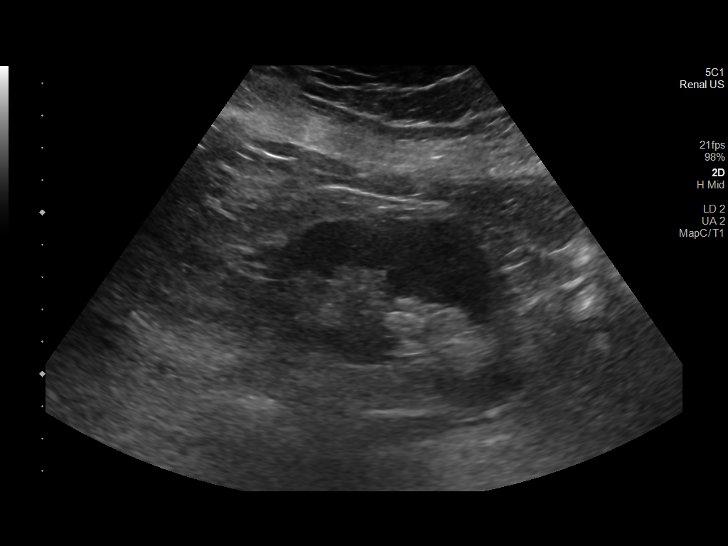
[im 10/37]
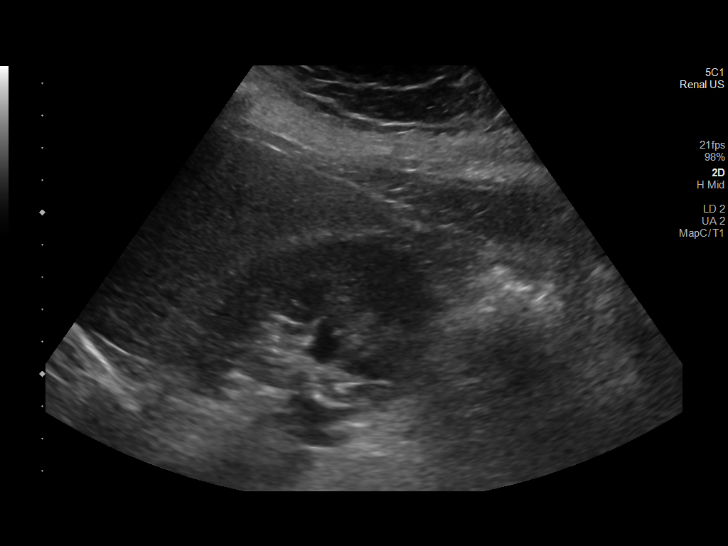
[im 13/37]
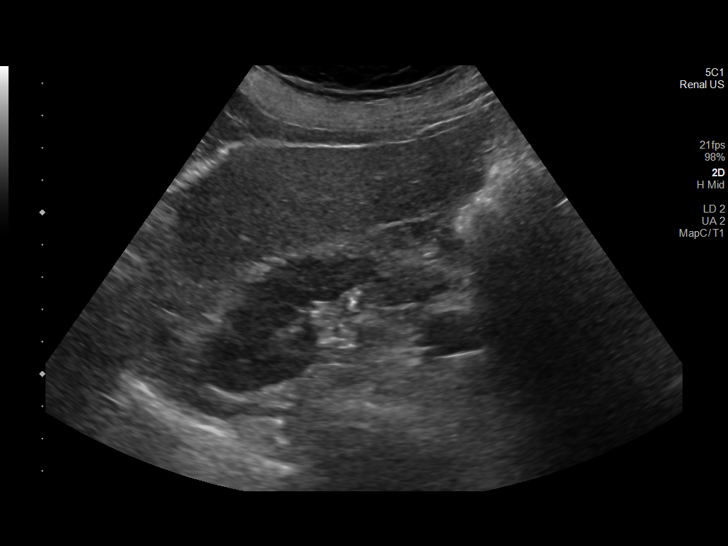
[im 14/37]
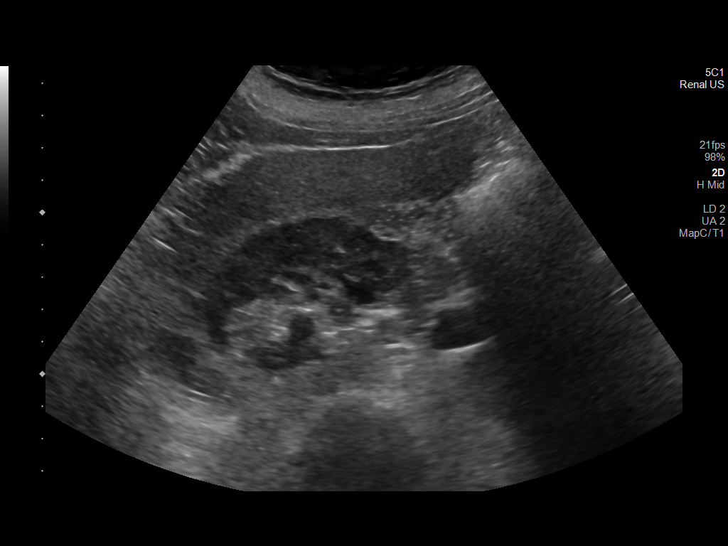
[im 17/37]
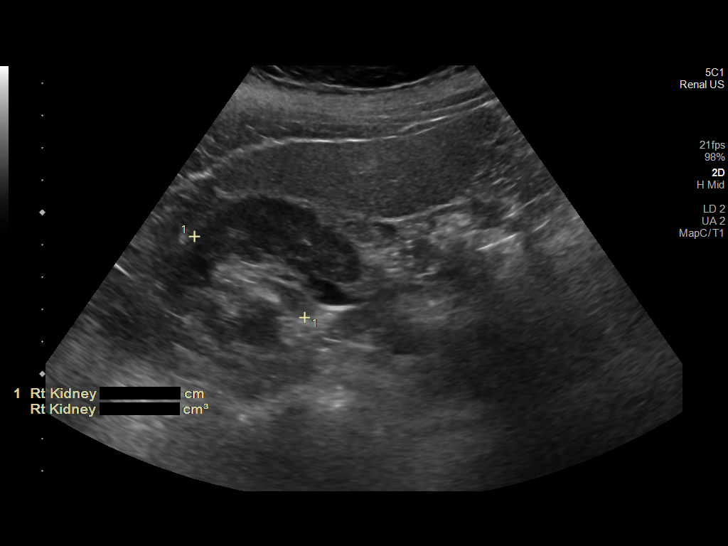
[im 20/37]
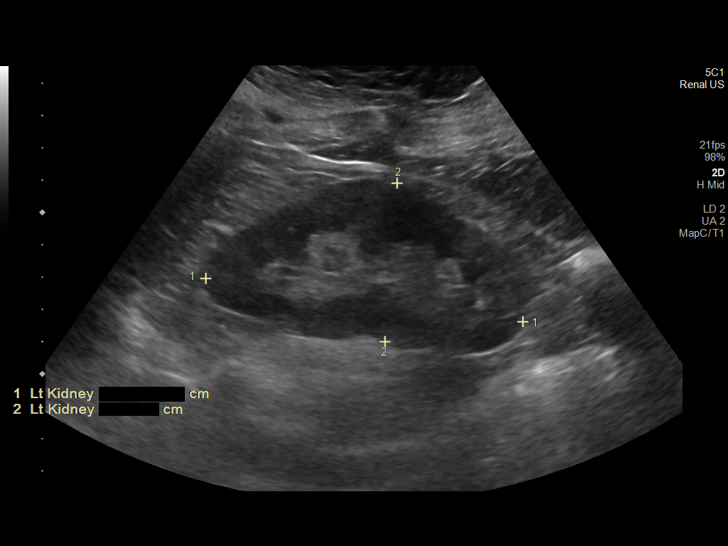
[im 23/37]
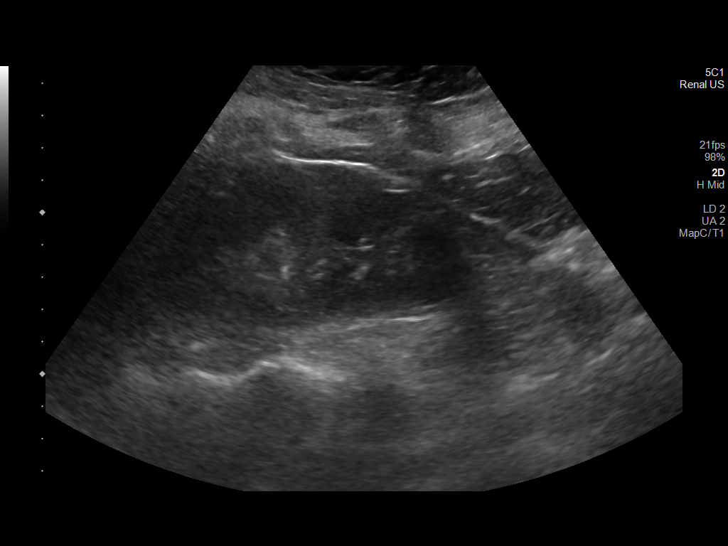
[im 25/37]
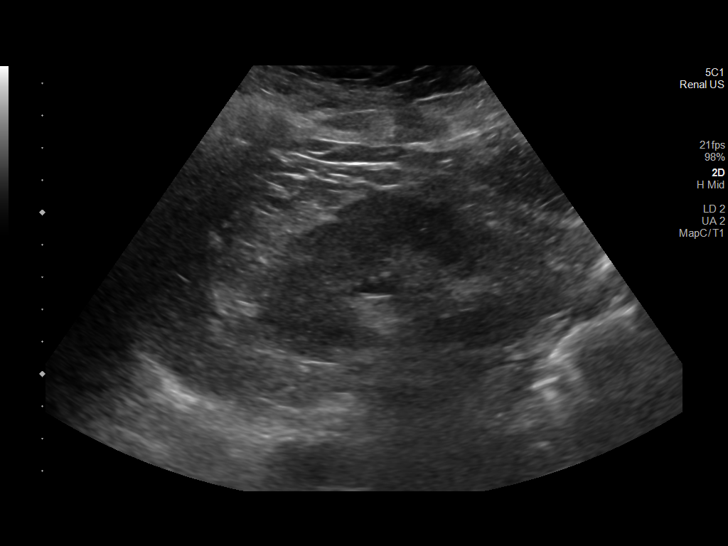
[im 28/37]
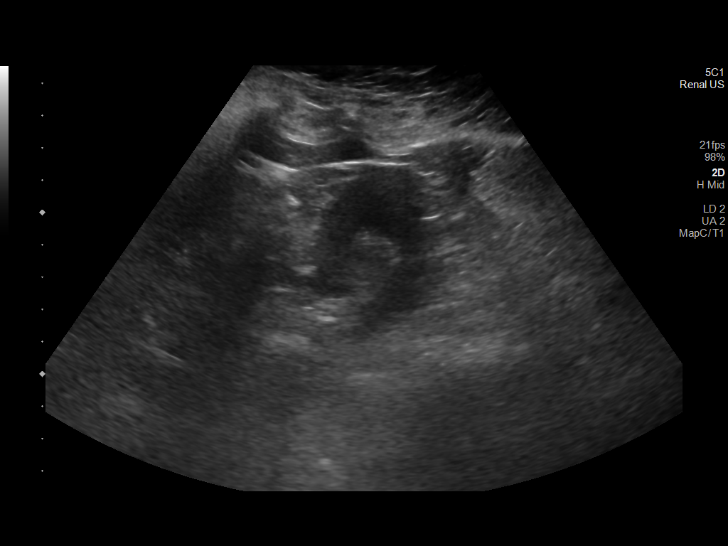
[im 31/37]
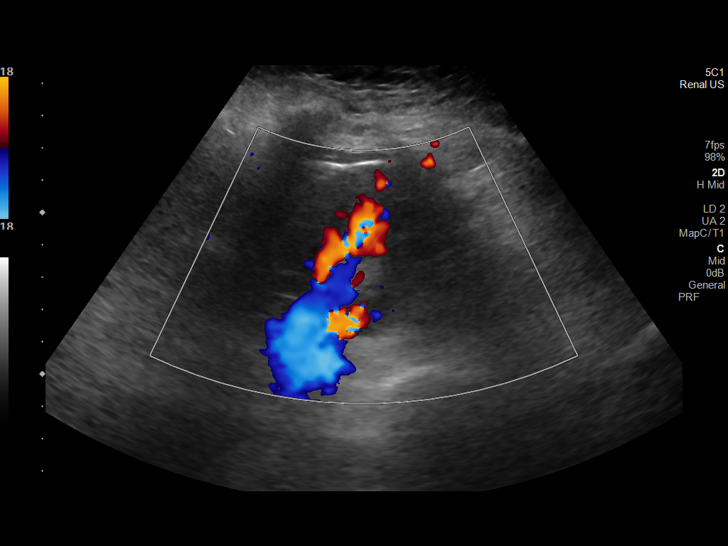
[im 34/37]
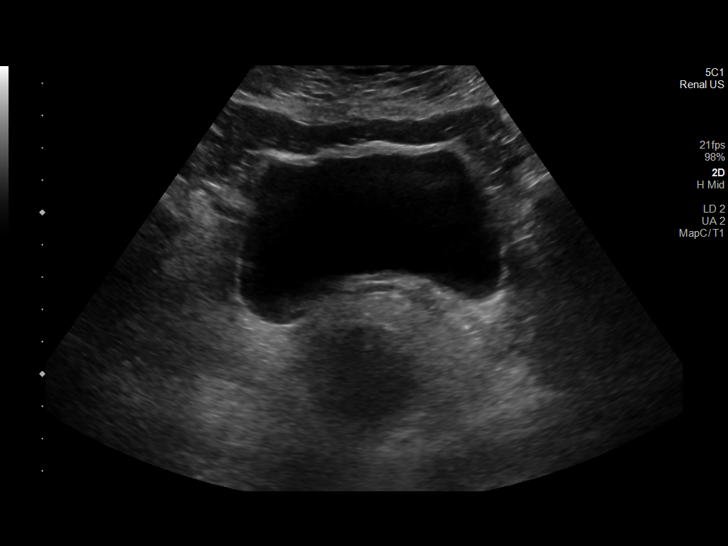
[im 37/37]
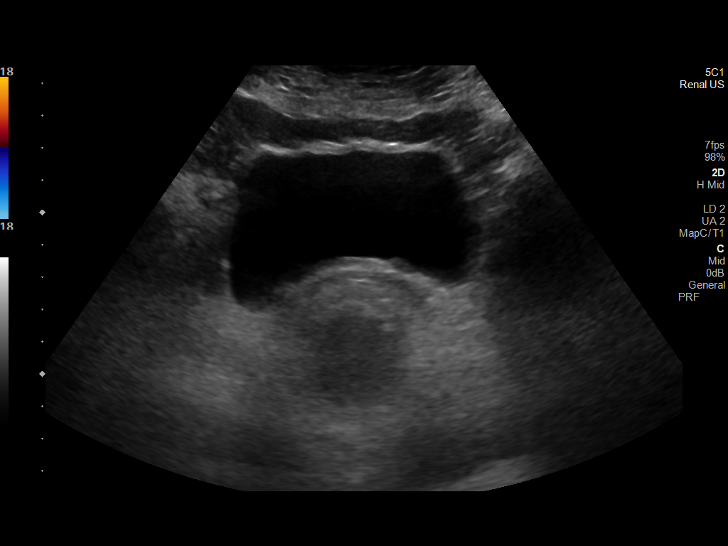

[14 of 25 positions shown; findings below may reference images not displayed]

FINDINGS: Right Kidney:

Renal measurements: 9.8 x 5.2 x 4.2 cm = volume: 113 mL.
Echogenicity within normal limits. No mass or hydronephrosis
visualized.

Left Kidney:

Renal measurements: 9.9 x 4.9 x 4 cm = volume: 101 mL. Echogenicity
within normal limits. No mass or hydronephrosis visualized.

Bladder:

Appears normal for degree of bladder distention.

Other:

None.
IMPRESSION: Unremarkable renal ultrasound.

## 2023-08-05 IMAGING — US US ABDOMEN LIMITED
1 series · 14 of 25 positions shown · non-contrast
Comparison: Current abdomen and pelvis CT.

CLINICAL DATA: Abdominal pain. Follow-up from current abdomen and
pelvis CT.

EXAM:
ULTRASOUND ABDOMEN LIMITED RIGHT UPPER QUADRANT

[Series 1: us abdomen limited ruq (liver/gb) · 14 of 47 slices shown]
[im 1/47]
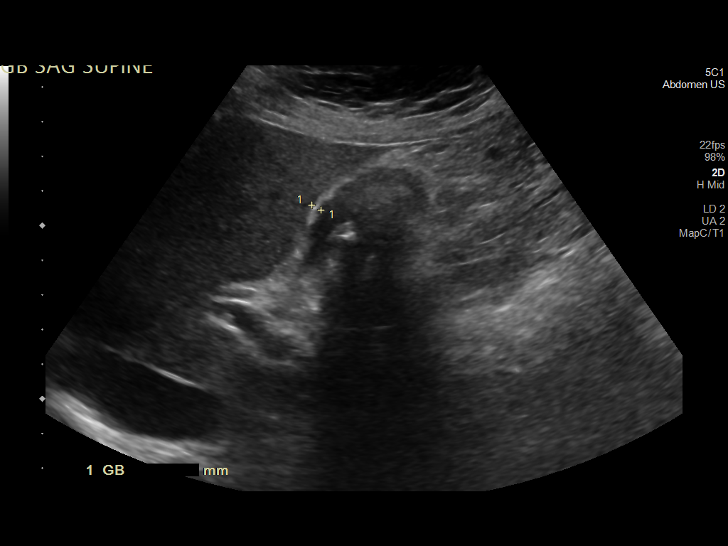
[im 4/47]
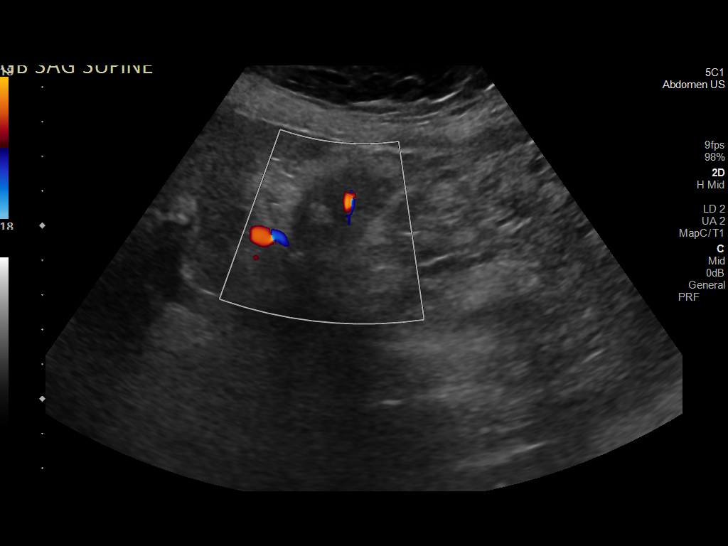
[im 8/47]
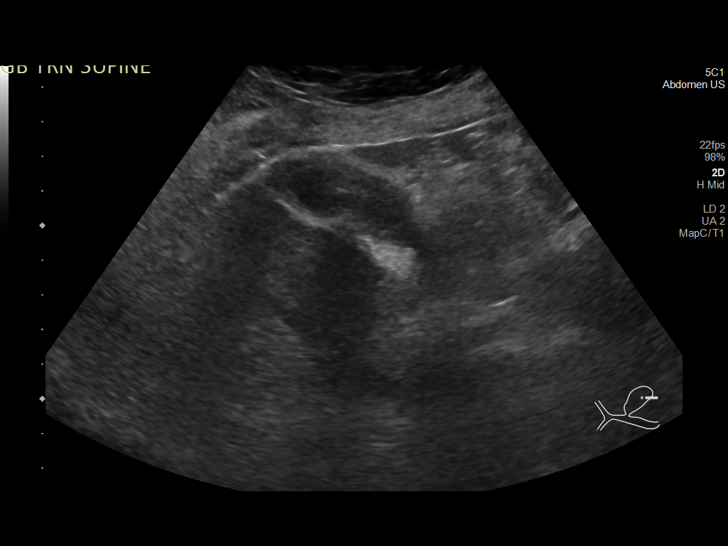
[im 12/47]
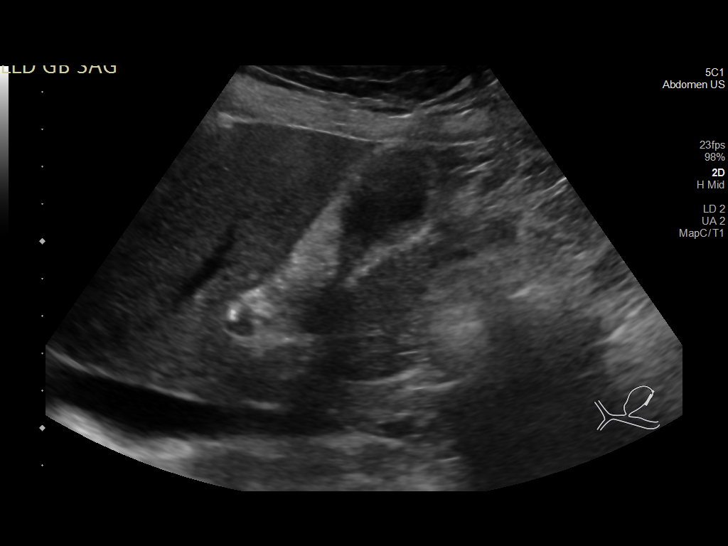
[im 16/47]
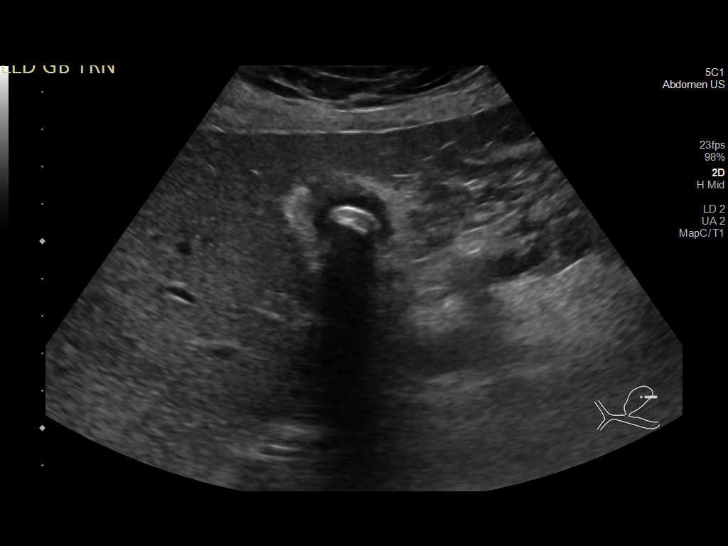
[im 18/47]
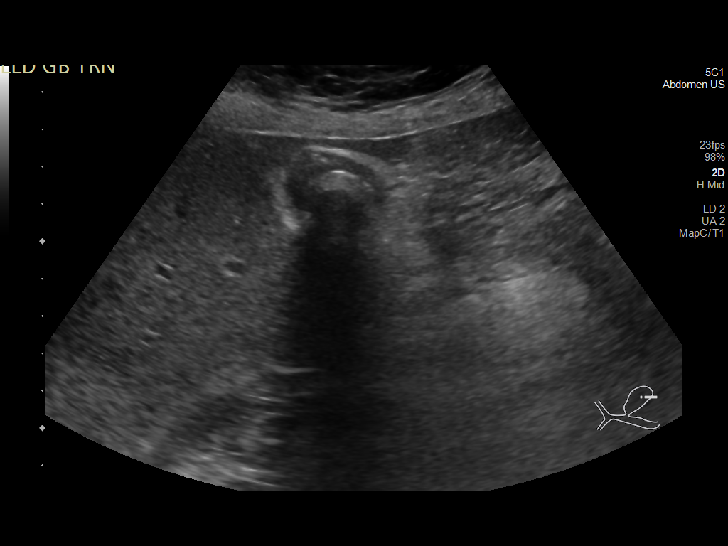
[im 22/47]
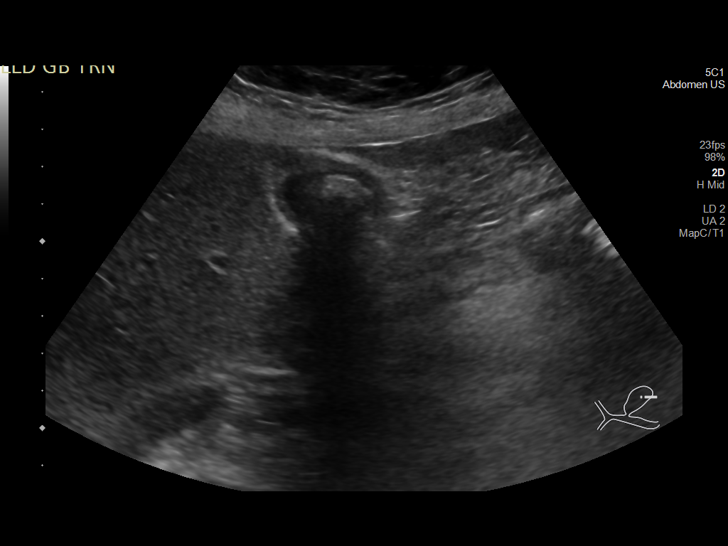
[im 25/47]
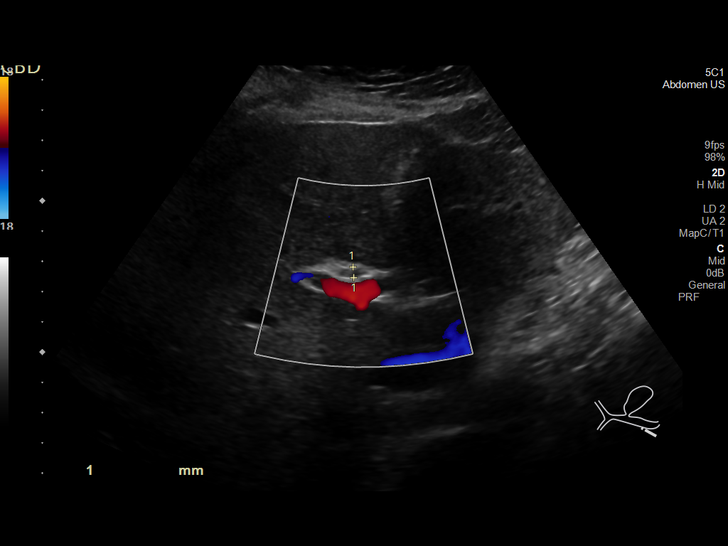
[im 29/47]
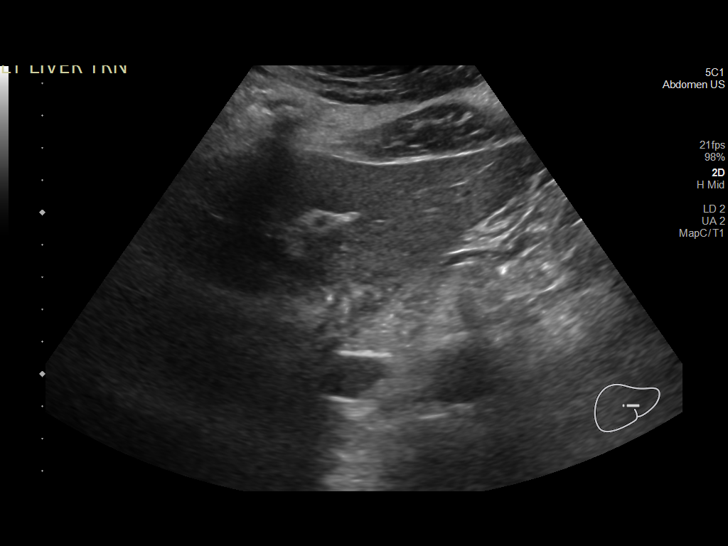
[im 31/47]
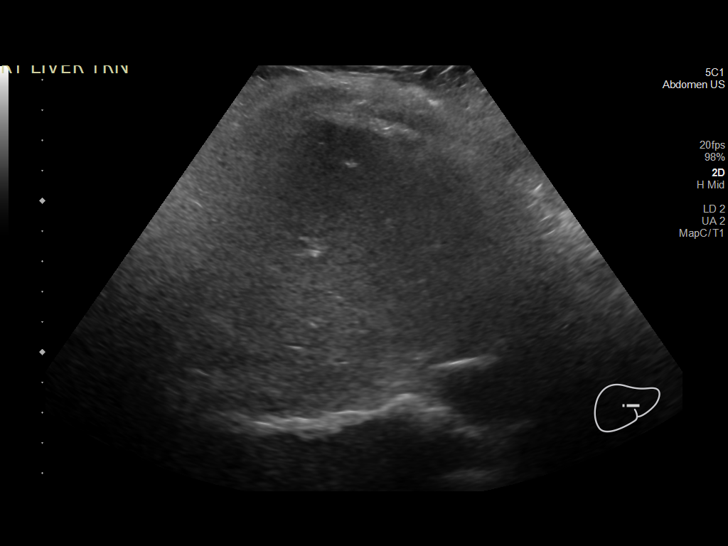
[im 35/47]
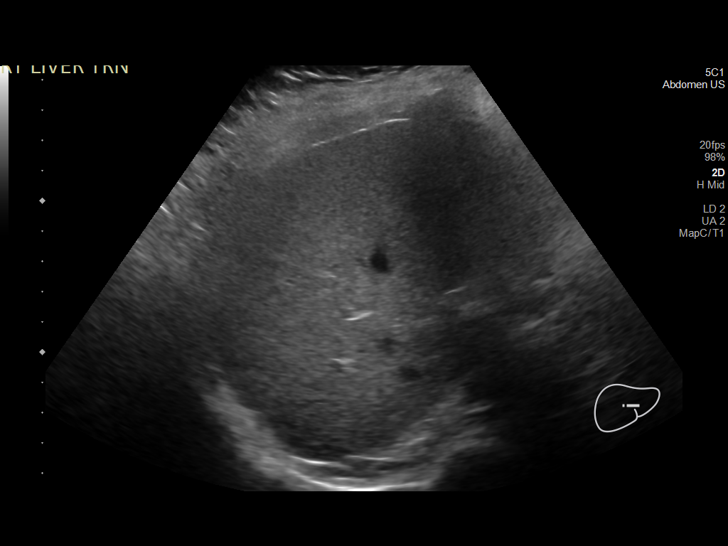
[im 39/47]
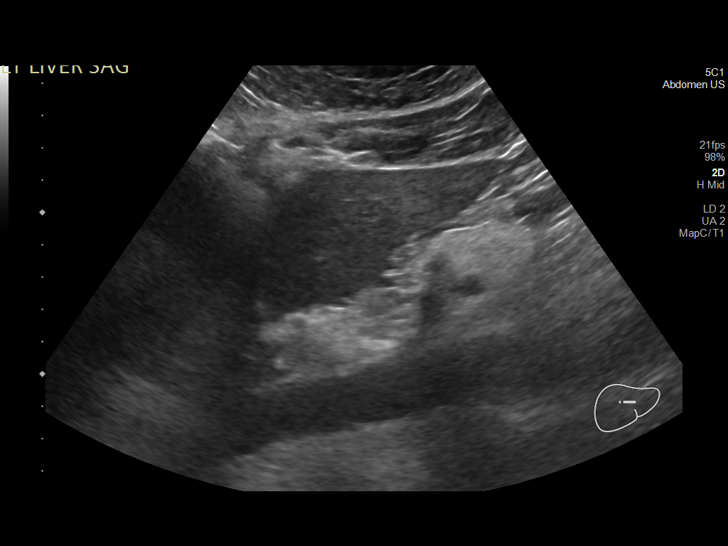
[im 43/47]
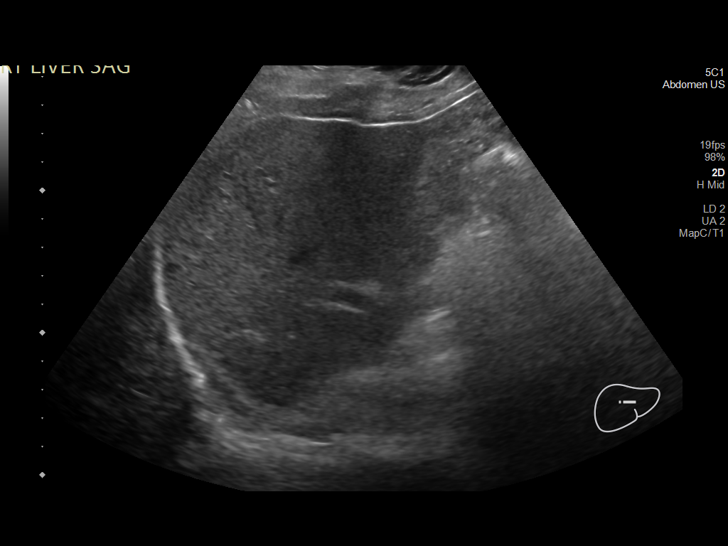
[im 47/47]
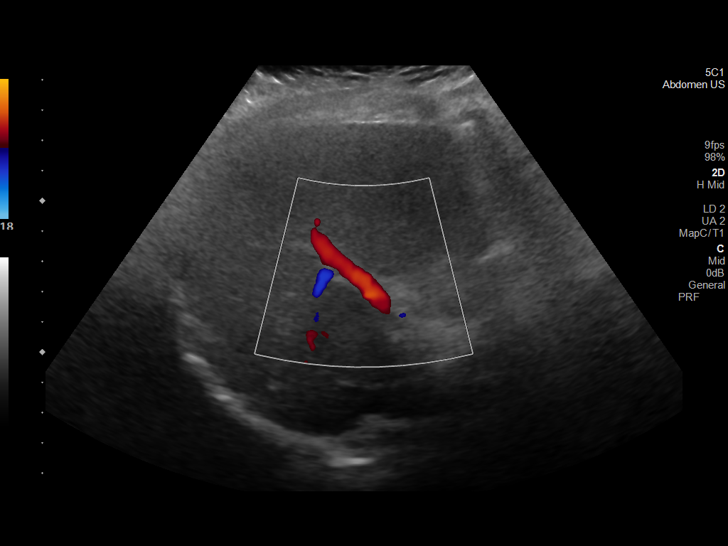

[14 of 25 positions shown; findings below may reference images not displayed]

FINDINGS: Gallbladder:

Multiple gallstones. Gallbladder wall measuring up to 6 mm in
thickness. Trace amount of pericholecystic fluid. Positive
sonographic Murphy sign

Common bile duct:

Diameter: 4 mm

Liver:

Mild increased parenchymal echogenicity. No masses. Portal vein is
patent on color Doppler imaging with normal direction of blood flow
towards the liver.

Other: None.
IMPRESSION: 1. Findings support acute cholecystitis with gallbladder wall
thickening, multiple stones, trace pericholecystic fluid and a
positive sonographic Murphy's sign.

## 2023-08-05 IMAGING — CT CT ABD-PELV W/ CM
2 of 4 series · 16 of 46 positions shown, 18 images · IV contrast (omnipaque)
Comparison: April 05, 2018.

CLINICAL DATA: Abdominal pain, acute, nonlocalized

EXAM:
CT ABDOMEN AND PELVIS WITH CONTRAST
TECHNIQUE: Multidetector CT imaging of the abdomen and pelvis was performed
using the standard protocol following bolus administration of
intravenous contrast.
CONTRAST:  100mL OMNIPAQUE IOHEXOL 300 MG/ML  SOLN

[Series 3: a/p w/ 5mm · axial · 0.73mm/px · z∈[-991,-611]mm · 13 of 85 slices shown, 15 images]
[im 5/85  soft-tissue]
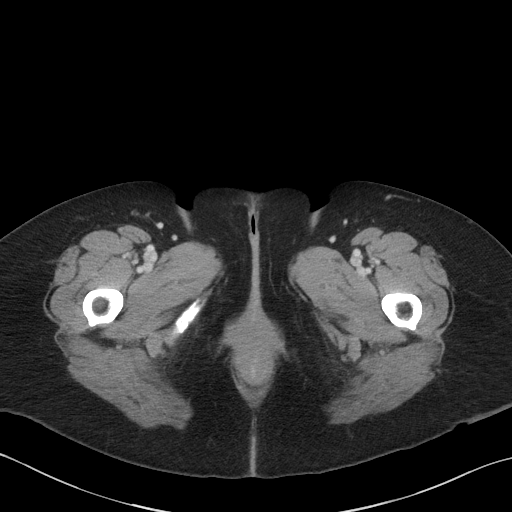
[im 5/85  bone]
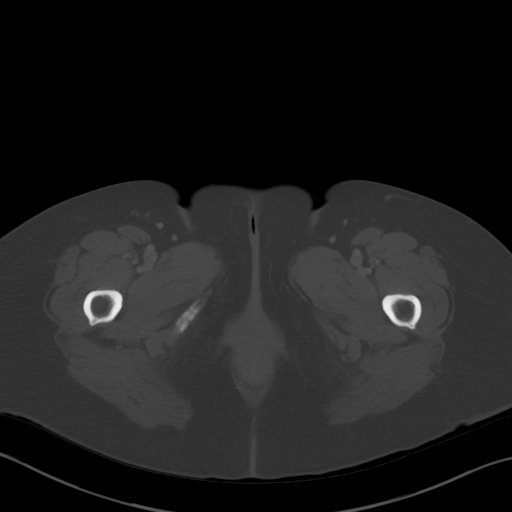
[im 13/85  soft-tissue]
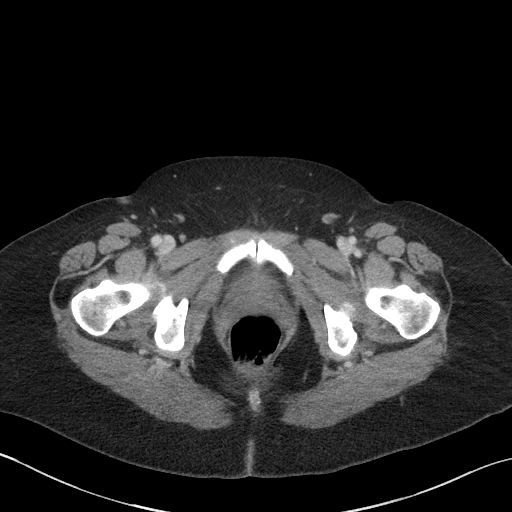
[im 17/85  soft-tissue]
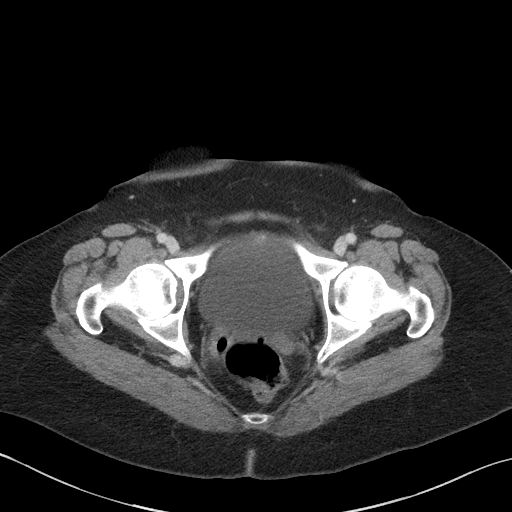
[im 25/85  soft-tissue]
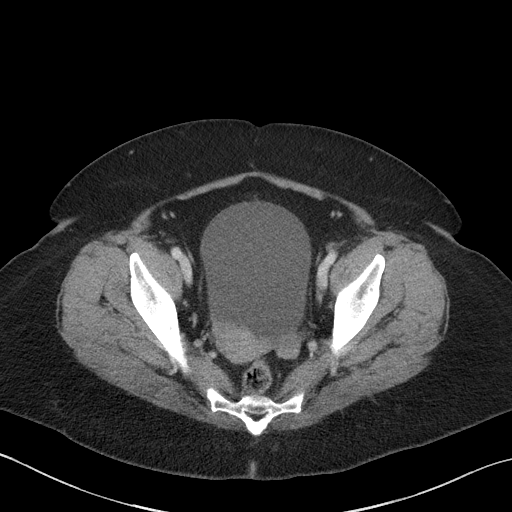
[im 29/85  soft-tissue]
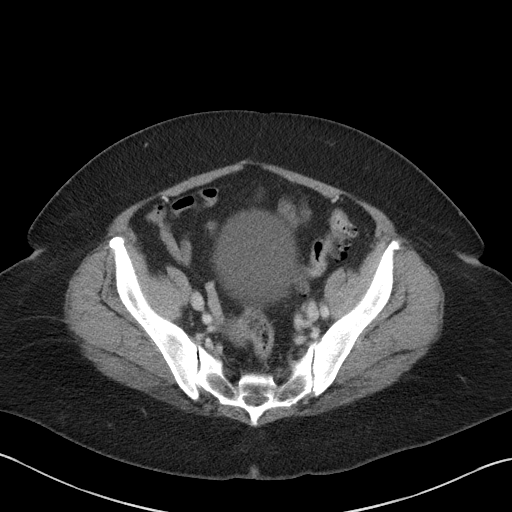
[im 37/85  soft-tissue]
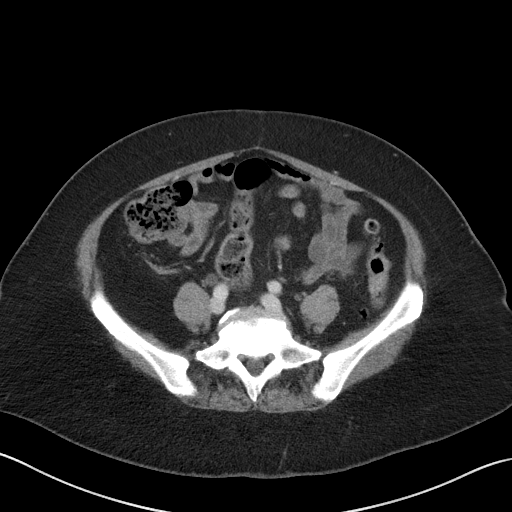
[im 45/85  soft-tissue]
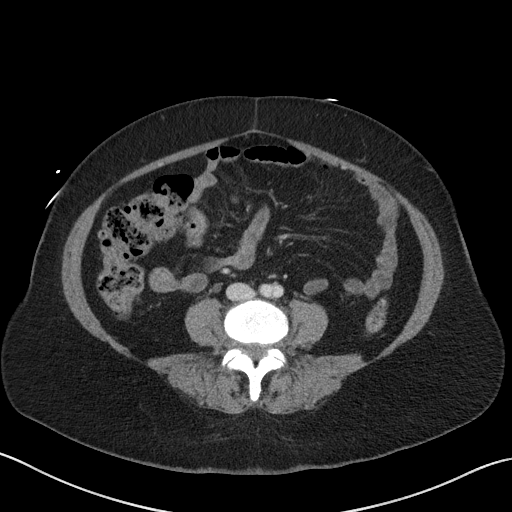
[im 49/85  soft-tissue]
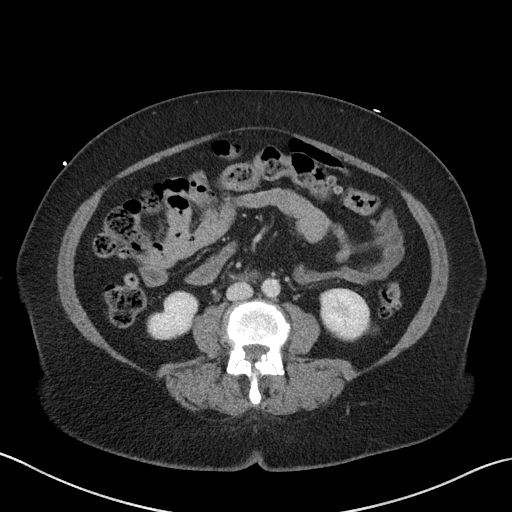
[im 57/85  soft-tissue]
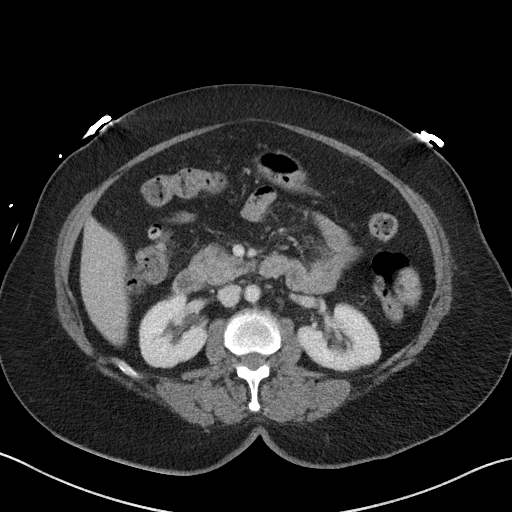
[im 57/85  bone]
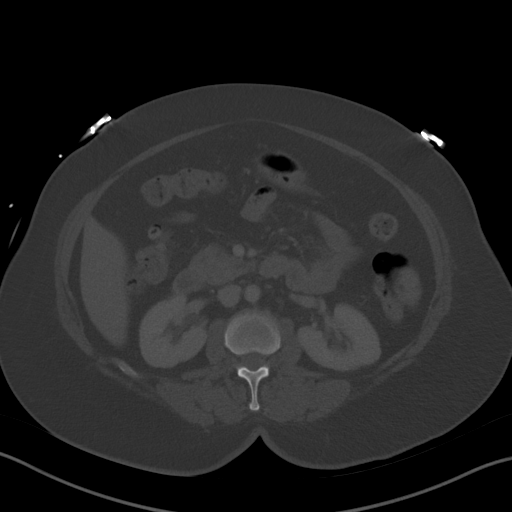
[im 61/85  soft-tissue]
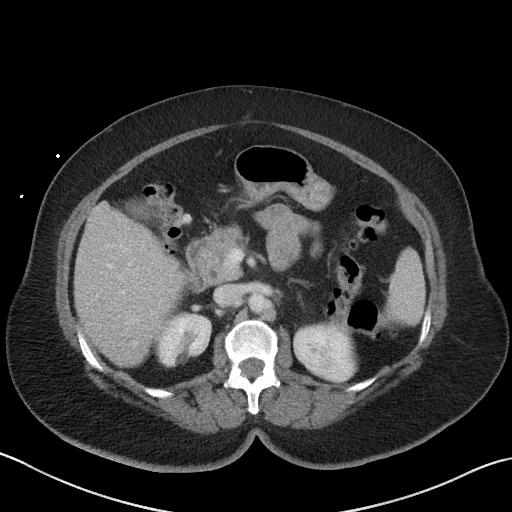
[im 69/85  soft-tissue]
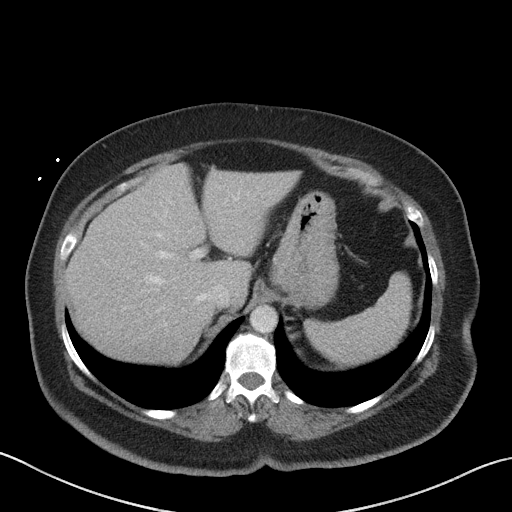
[im 73/85  soft-tissue]
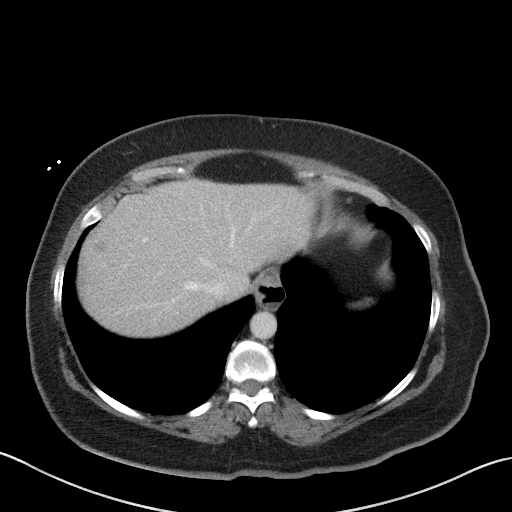
[im 81/85  soft-tissue]
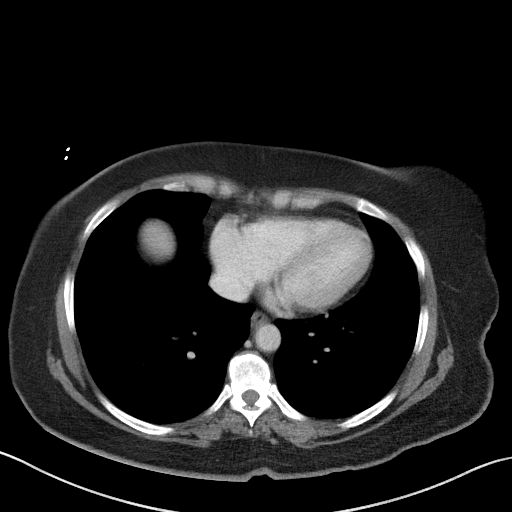

[Series 7: a/p w/ cor · coronal · 0.75mm/px · 3 of 139 slices shown]
[im 47/139  soft-tissue]
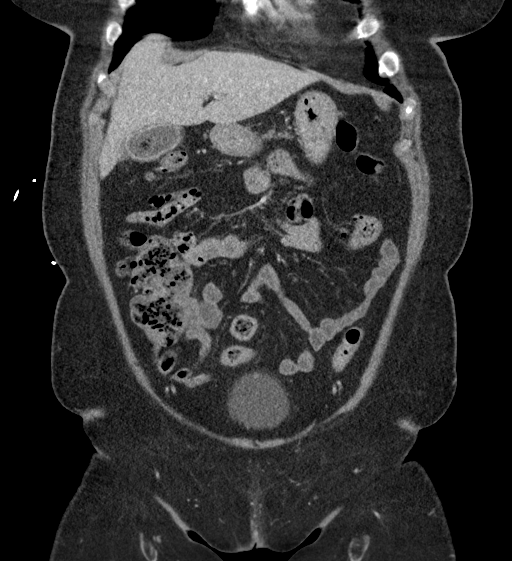
[im 62/139  soft-tissue]
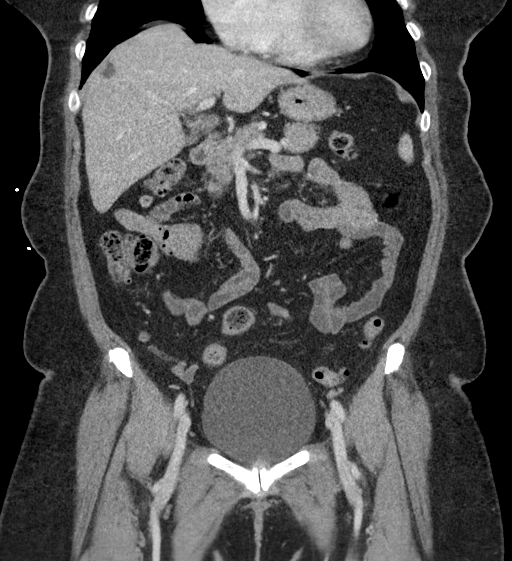
[im 77/139  soft-tissue]
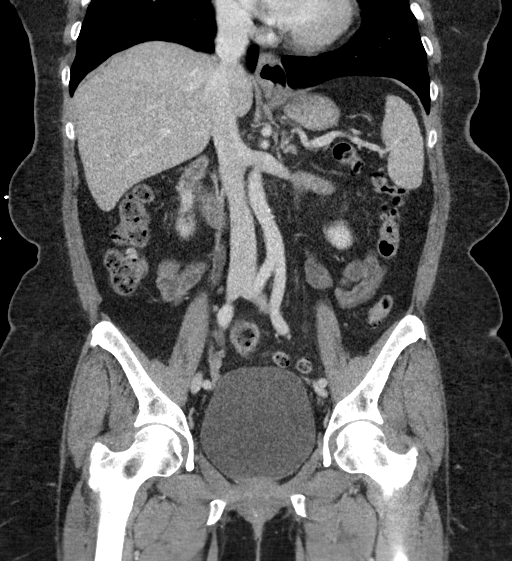

[16 of 46 positions shown; findings below may reference images not displayed]

FINDINGS: Lower chest: No acute abnormality.

Hepatobiliary: Cyst of the RIGHT hepatic dome. Additional
subcentimeter hypodense lesions are too small to accurately
characterize. There is gallbladder wall thickening and mucosal
enhancement. Gallbladder wall thickness measures approximately 5-6
mm. This is new since prior. Multiple cholelithiasis. There is
possible subtle adjacent fat stranding. No extrahepatic or
intrahepatic biliary ductal dilation.

Pancreas: Unremarkable. No pancreatic ductal dilatation or
surrounding inflammatory changes.

Spleen: Normal in size without focal abnormality.

Adrenals/Urinary Tract: Adrenal glands are unremarkable. Kidneys
enhance symmetrically. No hydronephrosis. Multifocal RIGHT-sided
cortical scarring. Subcentimeter hypodense lesions are too small to
accurately characterize. No obstructive nephrolithiasis. Bladder is
distended.

Stomach/Bowel: Tiny hiatal hernia. No evidence of bowel obstruction.
Appendix is normal. Scattered diverticulosis without evidence of
acute diverticulitis.

Vascular/Lymphatic: Atherosclerotic calcifications. No suspicious
lymphadenopathy.

Reproductive: Uterus and bilateral adnexa are unremarkable.

Other: No free air or free fluid.

Musculoskeletal: No acute or significant osseous findings.
IMPRESSION: 1. Constellation of findings are favored to reflect chronic
cholecystitis with a superimposed acute component. Recommend
correlation with physical exam.

Aortic Atherosclerosis ([id number]).

## 2023-08-06 ENCOUNTER — Telehealth: Payer: Self-pay | Admitting: Nurse Practitioner

## 2023-08-06 NOTE — Telephone Encounter (Signed)
Pt called in stating for the past two weeks she has been getting dizzy to the point to where her husband would have hold her up to walk. She has also had headaches an stomach pains during this time also. I transferred her over to nurse triage.

## 2023-08-06 NOTE — Progress Notes (Unsigned)
[facility] [address] [place] [address] Dept: [phone] Dept Fax: [phone]  Acute Care Office Visit  Subjective:   Michelle Valenzuela Oct 12, 1950 08/07/2023  No chief complaint on file.   HPI: Discussed the use of AI scribe software for clinical note transcription with the patient, who gave verbal consent to proceed.  History of Present Illness             The following portions of the patient's history were reviewed and updated as appropriate: past medical history, past surgical history, family history, social history, allergies, medications, and problem list.   Patient Active Problem List   Diagnosis Date Noted   Onychomycosis of toenail 01/01/2023   Eye pain, right 01/01/2023   Primary insomnia 12/18/2022   Paresthesia and pain of extremity 12/18/2022   Bilateral hip pain 01/31/2022   Chronic pain of both shoulders 01/31/2022   Dysuria 11/02/2021   Gastroesophageal reflux disease without esophagitis 11/02/2021   Hyperglycemia 11/02/2021   S/P laparoscopic cholecystectomy 06/25/2021   Gingivitis 01/26/2021   Vertigo 01/26/2021   Diverticulitis 03/25/2020   Nonintractable episodic headache 02/08/2020   Vaginal high risk HPV DNA test positive 01/14/2020   Asymptomatic age-related postmenopausal state 01/14/2020   Hypertriglyceridemia 01/14/2020   Left thyroid nodule 01/28/2019   Hyperthyroidism 01/28/2019   Past Medical History:  Diagnosis Date   Anxiety    Arthritis    Cataracts, bilateral    MD just watching    Hemorrhoids    HSV infection    Thyroid disease    Past Surgical History:  Procedure Laterality Date   CHOLECYSTECTOMY N/A 06/27/2021   Procedure: LAPAROSCOPIC CHOLECYSTECTOMY;  Surgeon: Rodman Pickle, MD;  Location: [facility] OR;  Service: General;  Laterality: N/A;   COLONOSCOPY  03/29/2022   HEMORRHOID SURGERY     OVARIAN CYST SURGERY     multiple cysts   UPPER  GASTROINTESTINAL ENDOSCOPY  03/29/2022   Family History  Problem Relation Age of Onset   Heart attack Father    Heart attack Paternal Grandmother    Kidney Stones Son    Diabetes Neg Hx    Hypertension Neg Hx    Colon cancer Neg Hx    Rectal cancer Neg Hx    Stomach cancer Neg Hx    Colon polyps Neg Hx    Esophageal cancer Neg Hx     Current Outpatient Medications:    estradiol (ESTRACE) 0.1 MG/GM vaginal cream, Place 0.5g at night two times a week (Patient not taking: Reported on 07/25/2023), Disp: 42.5 g, Rfl: 11   meclizine (ANTIVERT) 25 MG tablet, 1 tablet Orally every 8 hours as needed for dizziness for 10 days (Patient not taking: Reported on 07/25/2023), Disp: , Rfl:    methimazole (TAPAZOLE) 10 MG tablet, Take 1 tablet (10 mg total) by mouth daily., Disp: 90 tablet, Rfl: 3   methylPREDNISolone (MEDROL DOSEPAK) 4 MG TBPK tablet, See admin instructions. follow package directions (Patient not taking: Reported on 07/25/2023), Disp: , Rfl:    pantoprazole (PROTONIX) 40 MG tablet, Take 1 tablet (40 mg total) by mouth 2 (two) times daily before a meal. (Patient not taking: Reported on 07/25/2023), Disp: 180 tablet, Rfl: 3   sucralfate (CARAFATE) 1 g tablet, Take 1 g by mouth 4 (four) times daily. (Patient not taking: Reported on 07/25/2023), Disp: , Rfl:  No Known Allergies   ROS: A complete ROS was performed with pertinent positives/negatives noted in the HPI. The remainder of  the ROS are negative.    Objective:   There were no vitals filed for this visit.  GENERAL: Well-appearing, in NAD. Well nourished.  SKIN: Pink, warm and dry. No rash, lesion, ulceration, or ecchymoses.  NECK: Trachea midline. Full ROM w/o pain or tenderness. No lymphadenopathy.  RESPIRATORY: Chest wall symmetrical. Respirations even and non-labored. Breath sounds clear to auscultation bilaterally.  CARDIAC: S1, S2 present, regular rate and rhythm. Peripheral pulses 2+ bilaterally.  MSK: Muscle tone and  strength appropriate for age. Joints w/o tenderness, redness, or swelling. EXTREMITIES: Without clubbing, cyanosis, or edema.  NEUROLOGIC: No motor or sensory deficits. Steady, even gait.  PSYCH/MENTAL STATUS: Alert, oriented x 3. Cooperative, appropriate mood and affect.    No results found for any visits on 08/07/23.    Assessment & Plan:  Assessment and Plan               There are no diagnoses linked to this encounter. No orders of the defined types were placed in this encounter.  No orders of the defined types were placed in this encounter.  Lab Orders  No laboratory test(s) ordered today   No images are attached to the encounter or orders placed in the encounter.  No follow-ups on file.   Salvatore Decent, FNP

## 2023-08-06 NOTE — Telephone Encounter (Signed)
Follow up call regarding concerns using language line. Patient voiced she is doing ok, but had a dizzy episode this afternoon with no headache or stomach pain. Patient was schedule for OV with Salvatore Decent tomorrow. I advise patient to take her time with standing and lying down; and to move at a slow pace. Patient verbalized understanding.

## 2023-08-07 ENCOUNTER — Ambulatory Visit: Payer: 59 | Admitting: Obstetrics and Gynecology

## 2023-08-07 ENCOUNTER — Other Ambulatory Visit (HOSPITAL_COMMUNITY)
Admission: RE | Admit: 2023-08-07 | Discharge: 2023-08-07 | Disposition: A | Discharge status: Home / Self Care | Payer: 59 | Source: Ambulatory Visit | Attending: Obstetrics and Gynecology | Admitting: Obstetrics and Gynecology

## 2023-08-07 ENCOUNTER — Encounter: Payer: Self-pay | Admitting: Internal Medicine

## 2023-08-07 ENCOUNTER — Encounter: Payer: Self-pay | Admitting: Obstetrics and Gynecology

## 2023-08-07 ENCOUNTER — Ambulatory Visit (INDEPENDENT_AMBULATORY_CARE_PROVIDER_SITE_OTHER): Payer: 59 | Admitting: Internal Medicine

## 2023-08-07 VITALS — BP 136/82 | HR 87

## 2023-08-07 VITALS — BP 120/84 | HR 80 | Temp 98.0°F | Ht 60.8 in | Wt 168.2 lb

## 2023-08-07 DIAGNOSIS — R42 Dizziness and giddiness: Secondary | ICD-10-CM

## 2023-08-07 DIAGNOSIS — N952 Postmenopausal atrophic vaginitis: Secondary | ICD-10-CM

## 2023-08-07 DIAGNOSIS — N9489 Other specified conditions associated with female genital organs and menstrual cycle: Secondary | ICD-10-CM | POA: Insufficient documentation

## 2023-08-07 DIAGNOSIS — N390 Urinary tract infection, site not specified: Secondary | ICD-10-CM

## 2023-08-07 DIAGNOSIS — R300 Dysuria: Secondary | ICD-10-CM

## 2023-08-07 LAB — HEMOGLOBIN A1C: Hgb A1c MFr Bld: 5.7 % (ref 4.6–6.5)

## 2023-08-07 LAB — POCT URINALYSIS DIPSTICK
Bilirubin, UA: NEGATIVE
Blood, UA: NEGATIVE
Glucose, UA: NEGATIVE
Ketones, UA: NEGATIVE
Leukocytes, UA: NEGATIVE
Nitrite, UA: NEGATIVE
Protein, UA: POSITIVE — AB
Spec Grav, UA: 1.01 (ref 1.010–1.025)
Urobilinogen, UA: 0.2 U/dL
pH, UA: 7 (ref 5.0–8.0)

## 2023-08-07 LAB — COMPREHENSIVE METABOLIC PANEL
ALT: 12 U/L (ref 0–35)
AST: 13 U/L (ref 0–37)
Albumin: 3.9 g/dL (ref 3.5–5.2)
Alkaline Phosphatase: 99 U/L (ref 39–117)
BUN: 14 mg/dL (ref 6–23)
CO2: 28 meq/L (ref 19–32)
Calcium: 9.4 mg/dL (ref 8.4–10.5)
Chloride: 106 meq/L (ref 96–112)
Creatinine, Ser: 0.73 mg/dL (ref 0.40–1.20)
GFR: 81.72 mL/min (ref >60.00)
Glucose, Bld: 97 mg/dL (ref 70–99)
Potassium: 4.1 meq/L (ref 3.5–5.1)
Sodium: 140 meq/L (ref 135–145)
Total Bilirubin: 0.9 mg/dL (ref 0.2–1.2)
Total Protein: 6.5 g/dL (ref 6.0–8.3)

## 2023-08-07 LAB — CBC WITH DIFFERENTIAL/PLATELET
Basophils Absolute: 0 10*3/uL (ref 0.0–0.1)
Basophils Relative: 0.4 % (ref 0.0–3.0)
Eosinophils Absolute: 0.1 10*3/uL (ref 0.0–0.7)
Eosinophils Relative: 2 % (ref 0.0–5.0)
HCT: 41.8 % (ref 36.0–46.0)
Hemoglobin: 13.8 g/dL (ref 12.0–15.0)
Lymphocytes Relative: 36.3 % (ref 12.0–46.0)
Lymphs Abs: 2.6 10*3/uL (ref 0.7–4.0)
MCHC: 33 g/dL (ref 30.0–36.0)
MCV: 90.3 fL (ref 78.0–100.0)
Monocytes Absolute: 0.5 10*3/uL (ref 0.1–1.0)
Monocytes Relative: 7.3 % (ref 3.0–12.0)
Neutro Abs: 3.9 10*3/uL (ref 1.4–7.7)
Neutrophils Relative %: 54 % (ref 43.0–77.0)
Platelets: 247 10*3/uL (ref 150.0–400.0)
RBC: 4.62 Mil/uL (ref 3.87–5.11)
RDW: 12.9 % (ref 11.5–15.5)
WBC: 7.2 10*3/uL (ref 4.0–10.5)

## 2023-08-07 MED ORDER — ESTRADIOL 0.1 MG/GM VA CREA
TOPICAL_CREAM | VAGINAL | 11 refills | Status: DC
Start: 2023-08-07 — End: 2024-02-26

## 2023-08-07 MED ORDER — PHENAZOPYRIDINE HCL 95 MG PO TABS: 95.0000 mg | ORAL_TABLET | Freq: Three times a day (TID) | ORAL | 1 refills | Status: DC | PRN

## 2023-08-07 MED ORDER — NONFORMULARY OR COMPOUNDED ITEM: 5 refills | Status: DC

## 2023-08-07 NOTE — Patient Instructions (Addendum)
I will send in a compounded cream for the vaginal irritation/vulvodynia. Please use a blueberry sized amount nightly.   If the bladder starts to hurt please take a dose of Pyridium and see if this is helpful.  Today we talked about ways to manage bladder urgency such as altering your diet to avoid irritative beverages and foods (bladder diet) as well as attempting to decrease stress and other exacerbating factors.  You can also chew a plain Tums 1-3 times per day to make your urine less acidic, especially if you have eating/drinking acidic things.   There is a website with helpful information for people with bladder irritation, called the IC Network at https://www.ic-network.com. This website has more information about a healthy bladder diet and patient forums for support.  The Most Bothersome Foods* The Least Bothersome Foods*  Coffee - Regular & Decaf Tea - caffeinated Carbonated beverages - cola, non-colas, diet & caffeine-free Alcohols - Beer, Red Wine, White Wine, [address] - Grapefruit, [place], Orange, [employer] - Cranberry, Grapefruit, Orange, Pineapple Vegetables - Tomato & Tomato Products Flavor Enhancers - Hot peppers, Spicy foods, Chili, Horseradish, Vinegar, Monosodium glutamate (MSG) Artificial Sweeteners - NutraSweet, Sweet 'N Low, Equal (sweetener), Saccharin Ethnic foods - [place], [place], [place] food [employer] - low-fat & whole Fruits - Bananas, Blueberries, Honeydew melon, Pears, Raisins, Watermelon Vegetables - Broccoli, [address] Sprouts, [place], Carrots, Cauliflower, [place], Cucumber, Mushrooms, Peas, Radishes, Squash, Zucchini, White potatoes, Sweet potatoes & yams Poultry - Chicken, Eggs, [place], [employer] - Beef, Diplomatic Services operational officer, Lamb Seafood - Shrimp, [place] fish, Salmon Grains - Oat, Rice Snacks - Pretzels, Popcorn  *Lenward Chancellor et al. Diet and its role in interstitial cystitis/bladder pain syndrome (IC/BPS) and comorbid conditions. [employer]. BJU Int.  2012 Jan 11.

## 2023-08-07 NOTE — Progress Notes (Unsigned)
[facility] Return Visit  SUBJECTIVE  History of Present Illness: Michelle Valenzuela is a 72 y.o. female seen in follow-up for rUTI. Plan at last visit was ***.   Patient reports she has been taking cranberry extract and using estrogen cream. She reports she is still constantly having burning with urination but her last few cultures have been negative.     Past Medical History: Patient  has a past medical history of Anxiety, Arthritis, Cataracts, bilateral, Hemorrhoids, HSV infection, and Thyroid disease.   Past Surgical History: She  has a past surgical history that includes Hemorrhoid surgery; Ovarian cyst surgery; Cholecystectomy (N/A, 06/27/2021); Colonoscopy (03/29/2022); and Upper gastrointestinal endoscopy (03/29/2022).   Medications: She has a current medication list which includes the following prescription(s): methimazole.   Allergies: Patient has No Known Allergies.   Social History: Patient  reports that she has never smoked. She has never used smokeless tobacco. She reports that she does not drink alcohol and does not use drugs.      OBJECTIVE     Physical Exam: Vitals:   08/07/23 1511  BP: 136/82  Pulse: 87   Gen: No apparent distress, A&O x 3.  Detailed Urogynecologic Evaluation:  Deferred. Prior exam showed:      No data to display             ASSESSMENT AND PLAN    Michelle Valenzuela is a 73 y.o. with:  1. Dysuria

## 2023-08-08 ENCOUNTER — Telehealth: Payer: Self-pay | Admitting: Obstetrics and Gynecology

## 2023-08-08 LAB — CERVICOVAGINAL ANCILLARY ONLY
Bacterial Vaginitis (gardnerella): POSITIVE — AB
Candida Glabrata: NEGATIVE
Candida Vaginitis: NEGATIVE
Comment: NEGATIVE
Comment: NEGATIVE
Comment: NEGATIVE

## 2023-08-08 MED ORDER — METRONIDAZOLE 500 MG PO TABS
500.0000 mg | ORAL_TABLET | Freq: Two times a day (BID) | ORAL | 0 refills | Status: AC
Start: 2023-08-08 — End: 2023-08-15

## 2023-08-08 NOTE — Addendum Note (Signed)
Addended by: Selmer Dominion on: 08/08/2023 02:28 PM   Modules accepted: Orders

## 2023-08-08 NOTE — Telephone Encounter (Signed)
Entered in error

## 2023-08-12 ENCOUNTER — Ambulatory Visit: Payer: 59 | Admitting: Nurse Practitioner

## 2023-08-15 NOTE — Progress Notes (Signed)
Patient has been notified and is requesting a tier reduction on her Estradiol cream

## 2023-08-16 DIAGNOSIS — R300 Dysuria: Secondary | ICD-10-CM | POA: Diagnosis not present

## 2023-08-16 DIAGNOSIS — N3020 Other chronic cystitis without hematuria: Secondary | ICD-10-CM | POA: Diagnosis not present

## 2023-08-19 ENCOUNTER — Ambulatory Visit: Payer: 59 | Admitting: Nurse Practitioner

## 2023-08-19 ENCOUNTER — Telehealth: Payer: Self-pay | Admitting: Nurse Practitioner

## 2023-08-19 NOTE — Telephone Encounter (Signed)
11.18.24 no show/no letter sent

## 2023-08-20 NOTE — Telephone Encounter (Signed)
1st no show, letter sent via mychart (Spanish)

## 2023-09-11 DIAGNOSIS — R10814 Left lower quadrant abdominal tenderness: Secondary | ICD-10-CM | POA: Diagnosis not present

## 2023-09-11 DIAGNOSIS — R109 Unspecified abdominal pain: Secondary | ICD-10-CM | POA: Diagnosis not present

## 2023-10-11 ENCOUNTER — Other Ambulatory Visit: Payer: 59

## 2023-11-07 ENCOUNTER — Encounter: Payer: Self-pay | Admitting: Obstetrics and Gynecology

## 2023-11-07 ENCOUNTER — Ambulatory Visit: Payer: 59 | Admitting: Obstetrics and Gynecology

## 2023-11-07 VITALS — BP 122/75 | HR 84

## 2023-11-07 DIAGNOSIS — N816 Rectocele: Secondary | ICD-10-CM | POA: Diagnosis not present

## 2023-11-07 DIAGNOSIS — N8110 Cystocele, unspecified: Secondary | ICD-10-CM

## 2023-11-07 DIAGNOSIS — N9489 Other specified conditions associated with female genital organs and menstrual cycle: Secondary | ICD-10-CM | POA: Diagnosis not present

## 2023-11-07 DIAGNOSIS — N952 Postmenopausal atrophic vaginitis: Secondary | ICD-10-CM

## 2023-11-07 NOTE — Progress Notes (Signed)
 [facility] Return Visit  SUBJECTIVE  History of Present Illness:   Due to language barrier, an interpreter was present during the history-taking and subsequent discussion (and for part of the physical exam) with this patient.    Michelle Valenzuela is a 73 y.o. female seen in follow-up for vaginal burning, prolapse, UTI symptoms. Plan at last visit was start compounded cream for the burning at the vaginal opening and use estrogen cream for vaginal atrophy.   Patient reports she has still not been able to get her estrogen cream but she did get the compounded cream and reports it has helped some with the burning.   Patient was also asking about her infections and the dropped bladder    Past Medical History: Patient  has a past medical history of Anxiety, Arthritis, Cataracts, bilateral, Hemorrhoids, HSV infection, and Thyroid  disease.   Past Surgical History: She  has a past surgical history that includes Hemorrhoid surgery; Ovarian cyst surgery; Cholecystectomy (N/A, 06/27/2021); Colonoscopy (03/29/2022); and Upper gastrointestinal endoscopy (03/29/2022).   Medications: She has a current medication list which includes the following prescription(s): estradiol , methimazole , NONFORMULARY OR COMPOUNDED ITEM, and phenazopyridine .   Allergies: Patient has no known allergies.   Social History: Patient  reports that she has never smoked. She has never used smokeless tobacco. She reports that she does not drink alcohol and does not use drugs.     OBJECTIVE     Physical Exam: Vitals:   11/07/23 1459  BP: 122/75  Pulse: 84   Gen: No apparent distress, A&O x 3.  Detailed Urogynecologic Evaluation:  Upon exam patient's labia is still showing atrophic irritation.    ASSESSMENT AND PLAN    Michelle Valenzuela is a 73 y.o. with:  1. Vaginal burning   2. Vaginal atrophy   3. Prolapse of anterior vaginal wall   4. Prolapse of posterior vaginal wall    Patient  reports her burning has somewhat improved with the use of the compounded cream. We discussed that she needs the estrogen cream. We discussed for intercourse she should use lubrication. Though she is only minimally sexually active she should use lubrication to prevent irritation.  Patient has vaginal atrophy on exam. She would benefit from estrogen cream. Patient to use a blueberry sized amount into the vagina. She may use this nightly for 2 weeks and then twice weekly after. We discussed using her finger instead of using the applicator.  Patient has prolapse. We used the pop-q tool and discussed that her prolapse is not something she has to have surgery on if it is not bothersome and that the prolapse is not causing infection.  Patient was offered a pessary fitting to which she declined as she does not feel comfortable having something foreign in her body.   Patient to return in 6 months or sooner if needed.   Jemila Camille G Brayla Pat, NP

## 2023-11-07 NOTE — Patient Instructions (Signed)
 Use the estrogen cream every night for the first 2 weeks and then after the first two weeks use this twice weekly.   The compounded cream please use as you need to   You can consider using a pessary if the prolapse is bothering you more.

## 2023-12-23 ENCOUNTER — Ambulatory Visit: Payer: Self-pay

## 2023-12-23 NOTE — Telephone Encounter (Signed)
  Chief Complaint: dizziness Symptoms: dizziness, nausea, fatigue, "pressure in head" Frequency: x 2 months Pertinent Negatives: Patient denies headache, unilateral numbness or weakness, changes in vision, changes in speech,  vomiting, chest pain Disposition: [] ED /[] Urgent Care (no appt availability in office) / [x] Appointment(In office/virtual)/ []  [facility]/ [] Home Care/ [] Refused Recommended Disposition /[] [facility]/ []  Follow-up with PCP Additional Notes: Patient and her friend, Michelle Michelle Valenzuela, on the phone for triage. Patient states it feels like pressure in her head, dizziness. She states she was seen twice at urgent care in the past month for the symptoms. She states she has Michelle Valenzuela history of vertigo but this feels different and the urgent care told her to follow up with her PCP for symptoms. She states she feels about the same as her last evaluation by the provider at urgent care, denies severity or worsening of symptoms. Michelle Michelle Valenzuela states she can be reached at, and patient is requesting her for interpretation for the visit tomorrow [phone].  Copied from CRM [id number]. Topic: Clinical - Red Word Triage >> Dec 23, 2023  2:21 PM Michelle Michelle Valenzuela wrote: Red Word that prompted transfer to Nurse Triage: Patient states she's been feeling dizzy, pain on her joints and hard for her to walk. Reason for Disposition  [1] MODERATE dizziness (e.g., vertigo; feels very unsteady, interferes with normal activities) AND [2] has been evaluated by doctor (or NP/PA) for this  Answer Assessment - Initial Assessment Questions 1. DESCRIPTION: "Describe your dizziness."     Patient describes it as pressure in her head and dizziness, fatigue, body feels hot flashes.  2. VERTIGO: "Do you feel like either you or the room is spinning or tilting?"      Denies.  3. LIGHTHEADED: "Do you feel lightheaded?" (e.g., somewhat faint, woozy, weak upon standing)     Denies.  4. SEVERITY: "How bad is it?"  "Can  you walk?"   - MILD: Feels slightly dizzy and unsteady, but is walking normally.   - MODERATE: Feels unsteady when walking, but not falling; interferes with normal activities (e.g., school, work).   - SEVERE: Unable to walk without falling, or requires assistance to walk without falling.     Patient states she is able to walk around without falling, denies loss of balance.  5. ONSET:  "When did the dizziness begin?"     She states for Michelle Valenzuela "long long time and someone told me I have vertigo. It's getting worse and worse".  6. AGGRAVATING FACTORS: "Does anything make it worse?" (e.g., standing, change in head position)     Doing daily activities like going outside or cooking in the house.  7. CAUSE: "What do you think is causing the dizziness?"     Movement.  8. RECURRENT SYMPTOM: "Have you had dizziness before?" If Yes, ask: "When was the last time?" "What happened that time?"     Yes. She states she has had vertigo for Michelle Valenzuela long time. She was seen at urgent care twice Michelle Valenzuela month ago and told this episode is not vertigo and she should see the PCP.  9. OTHER SYMPTOMS: "Do you have any other symptoms?" (e.g., headache, weakness, numbness, vomiting, earache)     Patient denies headache but states it feels like "pressure in her head", fatigue, nausea  10. PREGNANCY: "Is there any chance you are pregnant?" "When was your last menstrual period?"       N/Michelle Valenzuela.  Protocols used: Dizziness - Vertigo-Michelle Valenzuela-AH

## 2023-12-24 ENCOUNTER — Ambulatory Visit (INDEPENDENT_AMBULATORY_CARE_PROVIDER_SITE_OTHER): Admitting: Internal Medicine

## 2023-12-24 ENCOUNTER — Encounter: Payer: Self-pay | Admitting: Internal Medicine

## 2023-12-24 ENCOUNTER — Encounter: Payer: Self-pay | Admitting: Neurology

## 2023-12-24 VITALS — BP 110/80 | HR 77 | Temp 98.3°F | Ht 60.8 in | Wt 165.2 lb

## 2023-12-24 DIAGNOSIS — R42 Dizziness and giddiness: Secondary | ICD-10-CM | POA: Diagnosis not present

## 2023-12-24 NOTE — Progress Notes (Signed)
 [facility] [address] [place] [address] Dept: [phone] Dept Fax: [phone]  Acute Care Office Visit  Subjective:   Michelle Valenzuela 12/13/1950 12/24/2023  Chief Complaint  Patient presents with   Dizziness    Started 2 weeks    Due to language barrier, a medical interpreter was present during the HPI, ROS, and discussion for the plan of care.  Interpreter: Michelle Valenzuela   HPI: Discussed the use of AI scribe software for clinical note transcription with the patient, who gave verbal consent to proceed.  History of Present Illness   Patient presents with intermittent dizziness that has been ongoing since 2017. The dizziness is not constant but comes and goes, often triggered by movement or stress. She describes the sensation as not severe enough to cause her to fall, but it is accompanied by a pressure in her head. Michelle Valenzuela reports that the dizziness is particularly noticeable when she moves from one place to another or when she is under stress. She also notes that the dizziness can be triggered when she is taking a shower and has to move around. Michelle Valenzuela had a fall about three to four months ago, but it is unclear if this incident is related to her dizziness. She was previously prescribed meclizine for the dizziness, which she reports was helpful. She was seen at urgent care 2 weeks ago for dizziness and abdominal pain, which has resolved. Did have lab work done at that time which was stable. Prior EKG performed at office visit was WNL.  She would like to be seen by neurology for further evaluation.         The following portions of the patient's history were reviewed and updated as appropriate: past medical history, past surgical history, family history, social history, allergies, medications, and problem list.   Patient Active Problem List   Diagnosis Date Noted   Onychomycosis of toenail 01/01/2023   Eye pain,  right 01/01/2023   Primary insomnia 12/18/2022   Paresthesia and pain of extremity 12/18/2022   Bilateral hip pain 01/31/2022   Chronic pain of both shoulders 01/31/2022   Dysuria 11/02/2021   Gastroesophageal reflux disease without esophagitis 11/02/2021   Hyperglycemia 11/02/2021   S/P laparoscopic cholecystectomy 06/25/2021   Gingivitis 01/26/2021   Vertigo 01/26/2021   Diverticulitis 03/25/2020   Nonintractable episodic headache 02/08/2020   Vaginal high risk HPV DNA test positive 01/14/2020   Asymptomatic age-related postmenopausal state 01/14/2020   Hypertriglyceridemia 01/14/2020   Left thyroid nodule 01/28/2019   Hyperthyroidism 01/28/2019   Past Medical History:  Diagnosis Date   Anxiety    Arthritis    Cataracts, bilateral    MD just watching    Hemorrhoids    HSV infection    Thyroid disease    Past Surgical History:  Procedure Laterality Date   CHOLECYSTECTOMY N/A 06/27/2021   Procedure: LAPAROSCOPIC CHOLECYSTECTOMY;  Surgeon: Kinsinger, De Blanch, MD;  Location: [facility] OR;  Service: General;  Laterality: N/A;   COLONOSCOPY  03/29/2022   HEMORRHOID SURGERY     OVARIAN CYST SURGERY     multiple cysts   UPPER GASTROINTESTINAL ENDOSCOPY  03/29/2022   Family History  Problem Relation Age of Onset   Heart attack Father    Heart attack Paternal Grandmother    Kidney Stones Son    Diabetes Neg Hx    Hypertension Neg Hx    Colon cancer Neg Hx    Rectal cancer Neg Hx  Stomach cancer Neg Hx    Colon polyps Neg Hx    Esophageal cancer Neg Hx     Current Outpatient Medications:    estradiol (ESTRACE) 0.1 MG/GM vaginal cream, Place 0.5g at night two times a week, Disp: 42.5 g, Rfl: 11   methimazole (TAPAZOLE) 10 MG tablet, Take 1 tablet (10 mg total) by mouth daily., Disp: 90 tablet, Rfl: 3   phenazopyridine (PYRIDIUM) 95 MG tablet, Take 1 tablet (95 mg total) by mouth 3 (three) times daily as needed for pain., Disp: 90 tablet, Rfl: 1   NONFORMULARY OR  COMPOUNDED ITEM, Amitriptyline 2.5%/ gabapentin 2.5%/ baclofen 2.5% in vaginal cream.  Place 1g daily in vaginal/ vulvar area.  Dispense 60g with 5 refills., Disp: 60 each, Rfl: 5 No Known Allergies   ROS: A complete ROS was performed with pertinent positives/negatives noted in the HPI. The remainder of the ROS are negative.    Objective:   Today's Vitals   12/24/23 0818  BP: 110/80  Pulse: 77  Temp: 98.3 F (36.8 C)  TempSrc: Temporal  SpO2: 98%  Weight: 165 lb 3.2 oz (74.9 kg)  Height: 5' 0.8" (1.544 m)    GENERAL: Well-appearing, in NAD. Well nourished.  SKIN: Pink, warm and dry. No rash, lesion, ulceration, or ecchymoses.  NECK: Trachea midline. Full ROM w/o pain or tenderness. No lymphadenopathy.  RESPIRATORY: Chest wall symmetrical. Respirations even and non-labored. Breath sounds clear to auscultation bilaterally.  CARDIAC: S1, S2 present, regular rate and rhythm. Peripheral pulses 2+ bilaterally.  EXTREMITIES: Without clubbing, cyanosis, or edema.  NEUROLOGIC: No motor or sensory deficits. Steady, even gait.  PSYCH/MENTAL STATUS: Alert, oriented x 3. Cooperative, appropriate mood and affect.    No results found for any visits on 12/24/23.    Assessment & Plan:  Assessment and Plan    Dizziness - Refer to neurology for further evaluation and workup.      No orders of the defined types were placed in this encounter.  Orders Placed This Encounter  Procedures   Ambulatory referral to Neurology    Referral Priority:   Routine    Referral Type:   Consultation    Referral Reason:   Specialty Services Required    Requested Specialty:   Neurology    Number of Visits Requested:   1   Lab Orders  No laboratory test(s) ordered today   No images are attached to the encounter or orders placed in the encounter.  Return if symptoms worsen or fail to improve.   Salvatore Decent, FNP

## 2023-12-27 ENCOUNTER — Encounter: Payer: Self-pay | Admitting: Neurology

## 2024-01-15 ENCOUNTER — Telehealth: Payer: Self-pay | Admitting: Internal Medicine

## 2024-01-15 ENCOUNTER — Other Ambulatory Visit: Payer: Self-pay

## 2024-01-15 DIAGNOSIS — E0590 Thyrotoxicosis, unspecified without thyrotoxic crisis or storm: Secondary | ICD-10-CM

## 2024-01-15 MED ORDER — METHIMAZOLE 10 MG PO TABS: 10.0000 mg | ORAL_TABLET | Freq: Every day | ORAL | 3 refills | Status: DC

## 2024-01-15 NOTE — Telephone Encounter (Signed)
 MEDICATION: methimazole methimazole (TAPAZOLE) 10 MG tablet  PHARMACY:    [id number] - [place], [place] - [address] AT [facility] OF [address] (Ph: [phone])    HAS THE PATIENT CONTACTED THEIR PHARMACY?  Yes  IS THIS A 90 DAY SUPPLY : Yes  IS PATIENT OUT OF MEDICATION: Yes  IF NOT; HOW MUCH IS LEFT:   LAST APPOINTMENT DATE: @10 /24/2024  NEXT APPOINTMENT DATE:@4 /28/2025  DO WE HAVE YOUR PERMISSION TO LEAVE A DETAILED MESSAGE?: Yes  OTHER COMMENTS: Patient lost bottle of medication 3 days ago.   **Let patient know to contact pharmacy at the end of the day to make sure medication is ready. **  ** Please notify patient to allow 48-72 hours to process**  **Encourage patient to contact the pharmacy for refills or they can request refills through [facility]**

## 2024-01-27 ENCOUNTER — Ambulatory Visit (INDEPENDENT_AMBULATORY_CARE_PROVIDER_SITE_OTHER): Payer: 59 | Admitting: Internal Medicine

## 2024-01-27 ENCOUNTER — Encounter: Payer: Self-pay | Admitting: Internal Medicine

## 2024-01-27 VITALS — BP 122/80 | HR 82 | Ht 60.8 in | Wt 168.2 lb

## 2024-01-27 DIAGNOSIS — E042 Nontoxic multinodular goiter: Secondary | ICD-10-CM | POA: Diagnosis not present

## 2024-01-27 DIAGNOSIS — H5789 Other specified disorders of eye and adnexa: Secondary | ICD-10-CM

## 2024-01-27 DIAGNOSIS — E0590 Thyrotoxicosis, unspecified without thyrotoxic crisis or storm: Secondary | ICD-10-CM

## 2024-01-27 LAB — TSH: TSH: 1.82 m[IU]/L (ref 0.40–4.50)

## 2024-01-27 LAB — T4, FREE: Free T4: 1.1 ng/dL (ref 0.8–1.8)

## 2024-01-27 NOTE — Progress Notes (Unsigned)
 Name: Michelle Valenzuela  MRN/ DOB: [id number], November 23, 1950    Age/ Sex: 73 y.o., female    PCP: Kandace Organ, NP   Reason for Endocrinology Evaluation: Hyperthyroidism     Date of Initial Endocrinology Evaluation: 07/23/2022    HPI: Michelle Valenzuela is a 73 y.o. female with a past medical history of Hyperthyroidism. The patient presented for initial endocrinology clinic visit on 07/23/2022 for consultative assistance with her Hyperthyroidism.   Pt has been diagnosed with hyperthyroidism in 2012 .  She has been on methimazole  since her diagnosis  She has a history of MNG with FNA , this was done in [place], these records are not available.  Prior TRAb negative  She was [facility] by [facility], prior to that she saw me twice in 2020, at the time I had ordered thyroid  uptake and scan but this was not done   Thyroid  ultrasound 08/2022 revealed  multinodular goiter, she is s/p benign FNA of the left thyroid  nodule  SUBJECTIVE:    Today (01/27/24): Michelle Valenzuela is here for follow-up on hyperthyroidism and multinodular goiter   She has not had her thyroid  ultrasound since her last visit here  Interpreter line was used today Moira Andrews # [phone] Weight has been fluctuating Denies local neck swelling, has sensitive thyroid  which triggers cough when she palpates her neck  Continues with occasional palpitations  Denies tremors  Continues with occasional  alternating diarrhea with  constipation  Has noted  burning and itching of the eyes, no recent eye exams   Methimazole  10 mg, 1 tabs daily     HISTORY:  Past Medical History:  Past Medical History:  Diagnosis Date  . Anxiety   . Arthritis   . Cataracts, bilateral    MD just watching   . Hemorrhoids   . HSV infection   . Thyroid  disease    Past Surgical History:  Past Surgical History:  Procedure Laterality Date  . CHOLECYSTECTOMY N/A 06/27/2021   Procedure: LAPAROSCOPIC  CHOLECYSTECTOMY;  Surgeon: Kinsinger, Alphonso Aschoff, MD;  Location: [facility] OR;  Service: General;  Laterality: N/A;  . COLONOSCOPY  03/29/2022  . HEMORRHOID SURGERY    . OVARIAN CYST SURGERY     multiple cysts  . UPPER GASTROINTESTINAL ENDOSCOPY  03/29/2022    Social History:  reports that she has never smoked. She has never used smokeless tobacco. She reports that she does not drink alcohol and does not use drugs. Family History: family history includes Heart attack in her father and paternal grandmother; Kidney Stones in her son.   HOME MEDICATIONS: Allergies as of 01/27/2024   No Known Allergies      Medication List        Accurate as of January 27, 2024  8:37 AM. If you have any questions, ask your nurse or doctor.          estradiol  0.1 MG/GM vaginal cream Commonly known as: ESTRACE  Place 0.5g at night two times a week   hyoscyamine 0.125 MG tablet Commonly known as: LEVSIN Take by mouth every 6 (six) hours as needed.   methimazole  10 MG tablet Commonly known as: TAPAZOLE  Take 1 tablet (10 mg total) by mouth daily.   NONFORMULARY OR COMPOUNDED ITEM Amitriptyline 2.5%/ gabapentin 2.5%/ baclofen 2.5% in vaginal cream.  Place 1g daily in vaginal/ vulvar area.  Dispense 60g with 5 refills.   phenazopyridine  95 MG tablet Commonly known as: PYRIDIUM  Take 1 tablet (95 mg total) by mouth  3 (three) times daily as needed for pain.          REVIEW OF SYSTEMS: A comprehensive ROS was conducted with the patient and is negative except as per HPI     OBJECTIVE:  VS: BP 122/80 (BP Location: Right Arm, Patient Position: Sitting, Cuff Size: Normal)   Pulse 82   Ht 5' 0.8" (1.544 m)   Wt 168 lb 3.2 oz (76.3 kg)   LMP  (LMP Unknown)   SpO2 99%   BMI 31.99 kg/m     Wt Readings from Last 3 Encounters:  01/27/24 168 lb 3.2 oz (76.3 kg)  12/24/23 165 lb 3.2 oz (74.9 kg)  08/07/23 168 lb 3.2 oz (76.3 kg)     EXAM: General: Pt appears well and is in NAD  Neck: General:  Supple without adenopathy. Thyroid : Thyroid  size normal.  No goiter or nodules appreciated. No thyroid  bruit.  Lungs: Clear with good BS bilat   Heart: Auscultation: RRR.  Extremities:  BL LE: No pretibial edema normal   Mental Status: Judgment, insight: Intact Orientation: Oriented to time, place, and person Mood and affect: No depression, anxiety, or agitation     DATA REVIEWED:   Latest Reference Range & Units 01/27/24 10:05  TSH 0.40 - 4.50 mIU/L 1.82  T4,Free(Direct) 0.8 - 1.8 ng/dL 1.1    Thyroid  ultrasound 08/09/2022  Estimated total number of nodules >/= 1 cm: 1   Number of spongiform nodules >/=  2 cm not described below (TR1): 0   Number of mixed cystic and solid nodules >/= 1.5 cm not described below (TR2): 0   _________________________________________________________   There is a 0.7 cm spongiform nodule in the left superior thyroid  which appears benign and does not warrant additional follow-up.   Nodule # 2:   Location: Left; Inferior   Maximum size: 4.0 cm; Other 2 dimensions: 2.5 x 2.0 cm   Composition: solid/almost completely solid (2)   Echogenicity: isoechoic (1)   Shape: not taller-than-wide (0)   Margins: ill-defined (0)   Echogenic foci: none (0)   ACR TI-RADS total points: 3.   ACR TI-RADS risk category: TR3 (3 points).   ACR TI-RADS recommendations:   **Given size (>/= 2.5 cm) and appearance, fine needle aspiration of this mildly suspicious nodule should be considered based on TI-RADS criteria.   _________________________________________________________   No cervical lymphadenopathy.   IMPRESSION: Solid nodule at the left inferior thyroid  (labeled 2, 4.0 cm) meets criteria (TI-RADS category 3) for tissue sampling. Recommend ultrasound-guided fine-needle aspiration.   FNA left nodule 10/04/2022   Clinical History: Left inferior 4.0cm; Other 2 dimensions: 2.5 x 2.0cm, Solid / almost completely solid, Isoechoic, TI-RADS total  points 3 Specimen Submitted:  A. THYROID , LEFT INFERIOR, FINE NEEDLE ASPIRATION:   FINAL MICROSCOPIC DIAGNOSIS: - Consistent with benign follicular nodule (Bethesda category II) - See diagnostic comment      ASSESSMENT/PLAN/RECOMMENDATIONS:   Hyperthyroidism:  -Patient is clinically euthyroid -TRAb negative in the past -This is most likely due to toxic thyroid  nodule -TFTs normal, we have room to decrease methimazole  - Will recheck TFTs in 2 months  Medications : Stop methimazole  10 mg, 1 tablet daily Start methimazole  5 mg, 1 tablet daily   2. MNG:  - No local neck symptoms  -She endorses benign FNA of the thyroid  while living in [place] years ago -She is S/P benign FNA of the left inferior thyroid  nodule 1//2024 - She has not had a thyroid  ultrasound despite placing the order for  it, I did encourage the patient to check voicemails as [facility] imaging a pretty good about contacting patient trying to schedule him for images.  - A new order has been placed   Follow-up in 6 months  Signed electronically by: Natale Bail, MD  [facility]  [facility] [address] [place], [place] [address] Phone: [phone] FAX: [phone]   CC: Kandace Organ, NP [address] [place] [address] Phone: [phone] Fax: [phone]   Return to Endocrinology clinic as below: Future Appointments  Date Time Provider Department Center  01/27/2024  9:10 AM Vashti Bolanos, Julian Obey, MD [facility] None  02/26/2024  9:00 AM Jhonny Moss, MD [facility] None  04/06/2024  1:40 PM [facility]-ANNUAL WELLNESS VISIT [facility]  05/06/2024  3:20 PM Zuleta, Kaitlin G, NP [facility]

## 2024-01-28 ENCOUNTER — Telehealth: Payer: Self-pay | Admitting: Internal Medicine

## 2024-01-28 MED ORDER — METHIMAZOLE 5 MG PO TABS: 5.0000 mg | ORAL_TABLET | Freq: Every day | ORAL | 3 refills | Status: DC

## 2024-01-28 NOTE — Telephone Encounter (Signed)
 Patient notified with interpreter ID [phone] and lab appt has been scheduled for 2 months out.

## 2024-01-28 NOTE — Telephone Encounter (Signed)
 Please contact the patient using interpreter line and let her know that her thyroid  results are within normal range and we do have room to decrease methimazole     Patient will need to decrease methimazole  from 10 mg daily to 5 mg daily.  She may take half a tablet of the ones  she has now at home, but a new prescription of methimazole  5 mg, 1 tablet daily has been sent to the pharmacy    Please schedule for lab appointment in 2 months   Thanks

## 2024-01-31 ENCOUNTER — Ambulatory Visit: Admitting: Neurology

## 2024-02-12 ENCOUNTER — Ambulatory Visit: Admitting: Neurology

## 2024-02-26 ENCOUNTER — Encounter: Payer: Self-pay | Admitting: Neurology

## 2024-02-26 ENCOUNTER — Ambulatory Visit (INDEPENDENT_AMBULATORY_CARE_PROVIDER_SITE_OTHER): Admitting: Neurology

## 2024-02-26 ENCOUNTER — Other Ambulatory Visit

## 2024-02-26 VITALS — BP 129/64 | HR 89 | Ht 59.06 in | Wt 169.6 lb

## 2024-02-26 DIAGNOSIS — R413 Other amnesia: Secondary | ICD-10-CM | POA: Diagnosis not present

## 2024-02-26 DIAGNOSIS — R42 Dizziness and giddiness: Secondary | ICD-10-CM | POA: Diagnosis not present

## 2024-02-26 NOTE — Patient Instructions (Addendum)
 Un placer conocerte.  1. Realizarte anlisis de sangre para determinar tu nivel de vitamina B12.  2. Programar una resonancia magntica cerebral sin contraste.  3. Continuar controlando la presin arterial en casa y mantenerse hidratado.  4. Seguimiento en 4-5 meses con la asistente mdica para trastornos de la memoria, Michelle Valenzuela.    Good to meet you.  Have bloodwork done for B12 level  2. Schedule MRI brain without contrast  3. Continue to monitor blood pressure at home, continue to stay hydrated  4. Follow-up in 4-5 months with Memory Disorders PA Tex Filbert

## 2024-02-26 NOTE — Progress Notes (Signed)
 NEUROLOGY CONSULTATION NOTE  Michelle Valenzuela MRN: [id number] DOB: [birth year]  Referring provider: Gavin Kast, FNP Primary care provider: Kathrene Parents, NP  Reason for consult:  dizziness  [phone] Michelle Valenzuela Thank you for your kind referral of Michelle Valenzuela for consultation of the above symptoms. Although her history is well known to you, please allow me to reiterate it for the purpose of our medical record. She is alone in the office today. Over-the-phone Spanish interpreter Michelle Valenzuela [phone] helps with the visit. Records and images were personally reviewed where available.   HISTORY OF PRESENT ILLNESS: This is a 73 year old right-handed woman with a history of hyperthyroidism presenting for evaluation of dizziness. She reports having an episode of very severe dizziness with spinning sensation in 2018 that improved when she got to the ER. Her son was told "it is not vertigo, a brain stroke that almost happened." She states she did not give it many importance and the vertigo did not recur, however she has had recurrent episodes of feeling lightheaded. No further spinning sensation. She reports feeling dizzy when she takes a shower and tries to move and feels the dizziness. When she is in the kitchen and moving, she has to stop and keep still because it feels like the dizziness is hitting in her head. She feels like she will fall but has not had any falls. The dizziness lasts only 10-20 seconds but during that time there is a a lot of pressure in her head for a fraction of a second. She reports feeling dizzy as well when sitting or when supine, when she moves in bed she feels things are briefly spinning but she is more concerned about the pressure in her head. She feels it more when she gets agitated or anxious/nervous. While sitting in the office, she feels like her cheeks are on fire and get red. The dizziness does not occur daily, it occurs all of a sudden, she can be outside and feel  it. She would take deep breaths.   She reports pain in her ears and throat feeling sore. No hearing loss. She has occasional left arm numbness and bilateral hand numbness separate from the dizziness. She is also concerned about her head and forgetting a lot of things over the past year. She only takes one medication and sometimes does not know if she took it or not. She cannot recall if she showered already. She does not drive. Her husband manages finances. She forgot to turn off the stove this week but this has only happened once. She denies any head injuries. She reports a strong family history of dementia (Alzheimer's) on her mother's side with 8 aunts and her older sister having dementia.  Laboratory Data: Lab Results  Component Value Date   TSH 1.82 01/27/2024     PAST MEDICAL HISTORY: Past Medical History:  Diagnosis Date   Anxiety    Arthritis    Cataracts, bilateral    MD just watching    Hemorrhoids    HSV infection    Thyroid  disease     PAST SURGICAL HISTORY: Past Surgical History:  Procedure Laterality Date   CHOLECYSTECTOMY N/A 06/27/2021   Procedure: LAPAROSCOPIC CHOLECYSTECTOMY;  Surgeon: Kinsinger, Alphonso Aschoff, MD;  Location: [facility] OR;  Service: General;  Laterality: N/A;   COLONOSCOPY  03/29/2022   HEMORRHOID SURGERY     OVARIAN CYST SURGERY     multiple cysts   UPPER GASTROINTESTINAL ENDOSCOPY  03/29/2022    MEDICATIONS: Current Outpatient  Medications on File Prior to Visit  Medication Sig Dispense Refill   methimazole  (TAPAZOLE ) 5 MG tablet Take 1 tablet (5 mg total) by mouth daily. 90 tablet 3   No current facility-administered medications on file prior to visit.    ALLERGIES: No Known Allergies  FAMILY HISTORY: Family History  Problem Relation Age of Onset   Heart attack Father    Heart attack Paternal Grandmother    Kidney Stones Son    Diabetes Neg Hx    Hypertension Neg Hx    Colon cancer Neg Hx    Rectal cancer Neg Hx    Stomach cancer Neg  Hx    Colon polyps Neg Hx    Esophageal cancer Neg Hx     SOCIAL HISTORY: Social History   Socioeconomic History   Marital status: Married    Spouse name: Not on file   Number of children: 4   Years of education: Not on file   Highest education level: Bachelor's degree (e.g., BA, AB, BS)  Occupational History   Occupation: retired  Tobacco Use   Smoking status: Never   Smokeless tobacco: Never  Vaping Use   Vaping status: Never Used  Substance and Sexual Activity   Alcohol use: Never   Drug use: Never   Sexual activity: Yes    Birth control/protection: Post-menopausal  Other Topics Concern   Not on file  Social History Narrative   Are you right handed or left handed? Right    Are you currently employed ? no   What is your current occupation? Retired    Do you live at home alone? No    Who lives with you? With husband    What type of home do you live in: 1 story or 2 story? 1 story       Social Drivers of Corporate investment banker Strain: Low Risk  (04/05/2023)   Overall Financial Resource Strain (CARDIA)    Difficulty of Paying Living Expenses: Not hard at all  Food Insecurity: No Food Insecurity (04/05/2023)   Hunger Vital Sign    Worried About Running Out of Food in the Last Year: Never true    Ran Out of Food in the Last Year: Never true  Transportation Needs: No Transportation Needs (04/05/2023)   PRAPARE - Administrator, Civil Service (Medical): No    Lack of Transportation (Non-Medical): No  Physical Activity: Inactive (04/05/2023)   Exercise Vital Sign    Days of Exercise per Week: 0 days    Minutes of Exercise per Session: 0 min  Stress: No Stress Concern Present (04/05/2023)   [employer] of Occupational Health - Occupational Stress Questionnaire    Feeling of Stress : Only a little  Social Connections: Moderately Integrated (04/05/2023)   Social Connection and Isolation Panel [NHANES]    Frequency of Communication with Friends and Family:  Three times a week    Frequency of Social Gatherings with Friends and Family: Once a week    Attends Religious Services: More than 4 times per year    Active Member of [employer] or Organizations: No    Attends Banker Meetings: Never    Marital Status: Married  Catering manager Violence: Not At Risk (04/05/2023)   Humiliation, Afraid, Rape, and Kick questionnaire    Fear of Current or Ex-Partner: No    Emotionally Abused: No    Physically Abused: No    Sexually Abused: No     PHYSICAL EXAM:  Orthostatic VS for the past 72 hrs (Last 3 readings):  Orthostatic BP Patient Position Cuff Size Orthostatic Pulse  02/26/24 0939 118/70 Standing Normal 88  02/26/24 0938 120/70 Sitting Normal 71  02/26/24 0937 130/68 Supine Normal 84   General: No acute distress Head:  Normocephalic/atraumatic Skin/Extremities: No rash, no edema Neurological Exam: Mental status: alert and oriented to person, place, and time, no dysarthria or aphasia, Fund of knowledge is appropriate.  Recent and remote memory are intact.  Attention and concentration are reduced, stated she could not do serial 7s.  Able to name objects and repeat phrases. MMSE 24/30    02/26/2024   12:00 PM  MMSE - Mini Mental State Exam  Orientation to time 5  Orientation to Place 5  Registration 3  Attention/ Calculation 0  Recall 3  Language- name 2 objects 2  Language- repeat 1  Language- follow 3 step command 2  Language- read & follow direction 1  Write a sentence 1  Copy design 1  Total score 24    Cranial nerves: CN I: not tested CN II: pupils equal, round, visual fields intact CN III, IV, VI:  full range of motion, no nystagmus, no ptosis CN V: facial sensation intact CN VII: upper and lower face symmetric CN VIII: hearing intact to conversation CN XI: sternocleidomastoid and trapezius muscles intact CN XII: tongue midline Bulk & Tone: normal, no fasciculations. Motor: 5/5 throughout with no pronator  drift. Deep Tendon Reflexes: +2 throughout Cerebellar: no incoordination on finger to nose testing Gait: narrow-based and steady, able to tandem walk adequately. Tremor: none   IMPRESSION: This is a 73 year old right-handed woman with a history of hyperthyroidism presenting for evaluation of dizziness. She reports dizziness occurs with positional changes, she is noted to have SBP drop from 130 to 118 supine to standing, likely the cause of lightheadedness. She was advised to continue adequate hydration and monitor BP at home when symptomatic. She also expressed concern about her memory, reporting a strong family history of dementia. Her neurological exam is normal, MMSE today 24/30. TSH normal, check B12. We discussed doing a brain MRI without contrast to assess for underlying structural abnormality and vascular load. We discussed the importance of control of vascular risk factors, physical exercise, brain stimulation exercises for overall brain health. Follow-up with Memory Disorders PA Tex Filbert in 4-5 months, call for any changes.   Thank you for allowing me to participate in the care of this patient. Please do not hesitate to call for any questions or concerns.   Rayfield Cairo, M.D.  CC: Gavin Kast, FNP, Kathrene Parents, NP

## 2024-02-27 LAB — VITAMIN B12: Vitamin B-12: 692 pg/mL (ref 200–1100)

## 2024-03-05 ENCOUNTER — Ambulatory Visit: Payer: Self-pay | Admitting: Neurology

## 2024-03-11 NOTE — Telephone Encounter (Signed)
Pt called no answer per DPR left a voice mail that labs are normal

## 2024-03-26 ENCOUNTER — Other Ambulatory Visit

## 2024-03-26 DIAGNOSIS — E0590 Thyrotoxicosis, unspecified without thyrotoxic crisis or storm: Secondary | ICD-10-CM | POA: Diagnosis not present

## 2024-03-26 LAB — TSH: TSH: 1.62 m[IU]/L (ref 0.40–4.50)

## 2024-03-26 LAB — T4, FREE: Free T4: 1.2 ng/dL (ref 0.8–1.8)

## 2024-03-29 NOTE — Progress Notes (Incomplete)
 Assessment/Plan:   Michelle Valenzuela is a very pleasant 73 y.o. year old RH female with a history of hypertension, hyperlipidemia, anxiety, arthritis, history of hyperthyroidism, history of symptomatic OH, seen today for evaluation of memory loss. MoCA today is /30.***.  Workup is in progress.  Patient is able to participate on ADLs and continues to drive without significant difficulties.***    Memory Impairment of unclear etiology Proceed with MRI of the brain without contrast to determine any structural abnormalities and vascular load Neurocognitive testing in Spanish to further evaluate cognitive concerns and determine other underlying cause of memory changes, including potential contribution from sleep, anxiety, attention, or depression  Check B12, TSH Continue to control mood as per PCP Recommend good control of cardiovascular risk factors Folllow up in 1-2  months***  Subjective:   The patient is accompanied by ***  who supplement  the history.   How long did patient have memory difficulties? For the last 1 year***.  Reports some difficulty remembering new information, conversations and names.  Long-term memory is good. repeats oneself?  Endorsed Disoriented when walking into a room?  Patient denies except occasionally not remembering what patient came to the room for ***  Leaving objects in unusual places? Denies.   Wandering behavior?  denies .  Any personality changes?  Denies.   Any history of depression?:  Denies   Hallucinations or paranoia?  Denies   Seizures?  Denies    Any sleep changes?   Sleeps well***does not sleep well***denies vivid dreams, REM behavior or sleepwalking   Sleep apnea?  Denies   Any hygiene concerns?  Sometimes she does not recall if she showered. Independent of bathing and dressing?  Endorsed  Does the patient needs help with medications? Patient is in charge, only takes her medications sometimes she may forget the dose.*** Who is in  charge of the finances?  Husband is in charge   *** Any changes in appetite?  Denies ***   Patient have trouble swallowing? Denies.   Does the patient cook? No ***  Any kitchen accidents such as leaving the stove on?  Only 1 time, no recurrence.  Any history of headaches?   Denies.   Chronic pain ? Denies.   Ambulates with difficulty?  Denies. *** Recent falls or head injuries? Denies.   Vision changes? Denies.   Any stroke like symptoms? Denies.   Any tremors?   Denies.   Any anosmia?  Denies.   Any incontinence of urine? Denies.   Any bowel dysfunction? Denies.      Patient lives with  *** History of heavy alcohol intake? Denies.   History of heavy tobacco use? Denies.   Family history of dementia?  Endorsed, strong family of Alzheimer's on her mother side with age and older sister with dementia Does patient drive?  She does not drive*** There is labs TSH 1.82  Past Medical History:  Diagnosis Date   Anxiety    Arthritis    Cataracts, bilateral    MD just watching    Hemorrhoids    HSV infection    Thyroid  disease      Past Surgical History:  Procedure Laterality Date   CHOLECYSTECTOMY N/A 06/27/2021   Procedure: LAPAROSCOPIC CHOLECYSTECTOMY;  Surgeon: Kinsinger, Herlene Righter, MD;  Location: [facility] OR;  Service: General;  Laterality: N/A;   COLONOSCOPY  03/29/2022   HEMORRHOID SURGERY     OVARIAN CYST SURGERY     multiple cysts   UPPER GASTROINTESTINAL ENDOSCOPY  03/29/2022     No Known Allergies  Current Outpatient Medications  Medication Instructions   methimazole  (TAPAZOLE ) 5 mg, Oral, Daily     VITALS:  There were no vitals filed for this visit.        No data to display             02/26/2024   12:00 PM  MMSE - Mini Mental State Exam  Orientation to time 5  Orientation to Place 5  Registration 3  Attention/ Calculation 0  Recall 3  Language- name 2 objects 2  Language- repeat 1  Language- follow 3 step command 2  Language- read & follow  direction 1  Write a sentence 1  Copy design 1  Total score 24     PHYSICAL EXAM   HEENT:  Normocephalic, atraumatic. The superficial temporal arteries are without ropiness or tenderness. Cardiovascular: Regular rate and rhythm. Lungs: Clear to auscultation bilaterally. Neck: There are no carotid bruits noted bilaterally.  Orientation:  Alert and oriented to person, place and time. No aphasia or dysarthria. Fund of knowledge is appropriate. Recent and remote memory intact.  Attention and concentration are normal.  Able to name objects and repeat phrases. Delayed recall  /5 Cranial nerves: There is good facial symmetry. Extraocular muscles are intact and visual fields are full to confrontational testing. Speech is fluent and clear. No tongue deviation. Hearing is intact to conversational tone. Tone: Tone is good throughout. Abnormal movements: No tremors. No Asterixis. No Fasciculations Sensation: Sensation is intact to light touch. Vibration is intact at the bilateral big toe.  Coordination: The patient has no difficulty with RAM's or FNF bilaterally. Normal finger to nose  Motor: Strength is 5/5 in the bilateral upper and lower extremities. There is no pronator drift. There are no fasciculations noted. DTR's: Deep tendon reflexes are 2/4 bilaterally. Gait and Station: The patient is able to ambulate without difficulty The patient is able to heel toe walk. Gait is cautious and narrow. The patient is able to ambulate in a tandem fashion.       Thank you for allowing us  the opportunity to participate in the care of this nice patient. Please do not hesitate to contact us  for any questions or concerns.   Total time spent on today's visit was *** minutes dedicated to this patient today, preparing to see patient, examining the patient, ordering tests and/or medications and counseling the patient, documenting clinical information in the EHR or other health record, independently interpreting  results and communicating results to the patient/family, discussing treatment and goals, answering patient's questions and coordinating care.  Cc:  Katheen Roselie Rockford, NP  Camie Sevin 03/29/2024 4:06 PM

## 2024-03-31 ENCOUNTER — Ambulatory Visit: Payer: Self-pay | Admitting: Internal Medicine

## 2024-04-01 ENCOUNTER — Ambulatory Visit: Admitting: Physician Assistant

## 2024-04-01 ENCOUNTER — Ambulatory Visit

## 2024-04-02 ENCOUNTER — Encounter: Payer: Self-pay | Admitting: Physician Assistant

## 2024-04-06 ENCOUNTER — Ambulatory Visit (INDEPENDENT_AMBULATORY_CARE_PROVIDER_SITE_OTHER): Payer: PPO

## 2024-04-06 DIAGNOSIS — Z1231 Encounter for screening mammogram for malignant neoplasm of breast: Secondary | ICD-10-CM | POA: Diagnosis not present

## 2024-04-06 DIAGNOSIS — Z0000 Encounter for general adult medical examination without abnormal findings: Secondary | ICD-10-CM

## 2024-04-06 DIAGNOSIS — E2839 Other primary ovarian failure: Secondary | ICD-10-CM

## 2024-04-06 NOTE — Progress Notes (Signed)
 Subjective:   Wilson Sample is a 73 y.o. who presents for a Medicare Wellness preventive visit.  As a reminder, Annual Wellness Visits don't include a physical exam, and some assessments may be limited, especially if this visit is performed virtually. We may recommend an in-person follow-up visit with your provider if needed.  Visit Complete: Virtual I connected with  Dedra Buel Karvonen on 04/06/24 by a audio enabled telemedicine application and verified that I am speaking with the correct person using two identifiers.  Patient Location: Home  Provider Location: Office/Clinic  I discussed the limitations of evaluation and management by telemedicine. The patient expressed understanding and agreed to proceed.  Vital Signs: Because this visit was a virtual/telehealth visit, some criteria may be missing or patient reported. Any vitals not documented were not able to be obtained and vitals that have been documented are patient reported.  VideoError- Librarian, academic were attempted between this provider and patient, however failed, due to patient having technical difficulties OR patient did not have access to video capability.  We continued and completed visit with audio only.   Persons Participating in Visit: Patient assisted by intrpreter.  AWV Questionnaire: No: Patient Medicare AWV questionnaire was not completed prior to this visit.  Cardiac Risk Factors include: advanced age (>56men, >58 women)     Objective:    Today's Vitals   04/06/24 1349  PainSc: 8    There is no height or weight on file to calculate BMI.     04/06/2024    2:03 PM 02/26/2024    9:26 AM 04/05/2023    1:40 PM 06/25/2021    6:06 AM  Advanced Directives  Does Patient Have a Medical Advance Directive? No No No No  Would patient like information on creating a medical advance directive?    No - Patient declined    Current Medications (verified) Outpatient Encounter  Medications as of 04/06/2024  Medication Sig   methimazole  (TAPAZOLE ) 5 MG tablet Take 1 tablet (5 mg total) by mouth daily.   No facility-administered encounter medications on file as of 04/06/2024.    Allergies (verified) Patient has no known allergies.   History: Past Medical History:  Diagnosis Date   Anxiety    Arthritis    Cataracts, bilateral    MD just watching    Hemorrhoids    HSV infection    Thyroid  disease    Past Surgical History:  Procedure Laterality Date   CHOLECYSTECTOMY N/A 06/27/2021   Procedure: LAPAROSCOPIC CHOLECYSTECTOMY;  Surgeon: Kinsinger, Herlene Righter, MD;  Location: [facility] OR;  Service: General;  Laterality: N/A;   COLONOSCOPY  03/29/2022   HEMORRHOID SURGERY     OVARIAN CYST SURGERY     multiple cysts   UPPER GASTROINTESTINAL ENDOSCOPY  03/29/2022   Family History  Problem Relation Age of Onset   Heart attack Father    Heart attack Paternal Grandmother    Kidney Stones Son    Diabetes Neg Hx    Hypertension Neg Hx    Colon cancer Neg Hx    Rectal cancer Neg Hx    Stomach cancer Neg Hx    Colon polyps Neg Hx    Esophageal cancer Neg Hx    Social History   Socioeconomic History   Marital status: Married    Spouse name: Not on file   Number of children: 4   Years of education: Not on file   Highest education level: Bachelor's degree (e.g., BA, AB, BS)  Occupational History   Occupation: retired  Tobacco Use   Smoking status: Never   Smokeless tobacco: Never  Vaping Use   Vaping status: Never Used  Substance and Sexual Activity   Alcohol use: Never   Drug use: Never   Sexual activity: Yes    Birth control/protection: Post-menopausal  Other Topics Concern   Not on file  Social History Narrative   Are you right handed or left handed? Right    Are you currently employed ? no   What is your current occupation? Retired    Do you live at home alone? No    Who lives with you? With husband    What type of home do you live in: 1 story or  2 story? 1 story       Social Drivers of Corporate investment banker Strain: Low Risk  (04/06/2024)   Overall Financial Resource Strain (CARDIA)    Difficulty of Paying Living Expenses: Not hard at all  Food Insecurity: No Food Insecurity (04/06/2024)   Hunger Vital Sign    Worried About Running Out of Food in the Last Year: Never true    Ran Out of Food in the Last Year: Never true  Transportation Needs: No Transportation Needs (04/06/2024)   PRAPARE - Administrator, Civil Service (Medical): No    Lack of Transportation (Non-Medical): No  Physical Activity: Sufficiently Active (04/06/2024)   Exercise Vital Sign    Days of Exercise per Week: 7 days    Minutes of Exercise per Session: 60 min  Stress: No Stress Concern Present (04/06/2024)   [employer] of Occupational Health - Occupational Stress Questionnaire    Feeling of Stress: Only a little  Social Connections: Moderately Integrated (04/06/2024)   Social Connection and Isolation Panel    Frequency of Communication with Friends and Family: More than three times a week    Frequency of Social Gatherings with Friends and Family: Once a week    Attends Religious Services: More than 4 times per year    Active Member of [employer] or Organizations: No    Attends Engineer, structural: Never    Marital Status: Married    Tobacco Counseling Counseling given: Not Answered    Clinical Intake:  Pre-visit preparation completed: Yes  Pain : 0-10 Pain Score: 8  Pain Type: Chronic pain Pain Location: Generalized Pain Descriptors / Indicators: Aching Pain Onset: More than a month ago Pain Frequency: Constant     Nutritional Risks: None Diabetes: No  Lab Results  Component Value Date   HGBA1C 5.7 08/07/2023   HGBA1C 5.7 01/01/2023   HGBA1C 5.6 11/02/2021     How often do you need to have someone help you when you read instructions, pamphlets, or other written materials from your doctor or pharmacy?: 1 -  Never  Interpreter Needed?: Yes Interpreter Agency: [employer] Interpreter Name: Foy Ames ID: [id number]  Information entered by :: NAllen LPN   Activities of Daily Living     04/06/2024    1:53 PM  In your present state of health, do you have any difficulty performing the following activities:  Hearing? 0  Vision? 0  Difficulty concentrating or making decisions? 1  Comment trouble with memory sometimes  Walking or climbing stairs? 0  Dressing or bathing? 0  Doing errands, shopping? 0  Preparing Food and eating ? N  Using the Toilet? N  In the past six months, have you accidently leaked urine? N  Do you have problems with loss of bowel control? N  Managing your Medications? N  Managing your Finances? N  Housekeeping or managing your Housekeeping? N    Patient Care Team: Nche, Roselie Rockford, NP as PCP - General (Internal Medicine) Georjean Darice HERO, MD as Consulting Physician (Neurology)  I have updated your Care Teams any recent Medical Services you may have received from other providers in the past year.     Assessment:   This is a routine wellness examination for [facility]  Hearing/Vision screen Hearing Screening - Comments:: Denies hearing issues Vision Screening - Comments:: No regular eye exams,    Goals Addressed             This Visit's Progress    Patient Stated       04/06/2024, denies goals       Depression Screen     04/06/2024    2:07 PM 12/24/2023    8:17 AM 08/07/2023    8:19 AM 04/05/2023    1:44 PM 12/18/2022    1:13 PM 11/02/2021   12:13 PM 01/11/2020   10:30 AM  PHQ 2/9 Scores  PHQ - 2 Score 1 1 2  0 1 0 0  PHQ- 9 Score    2  1     Fall Risk     04/06/2024    2:05 PM 02/26/2024    9:26 AM 12/24/2023    8:17 AM 08/07/2023    8:19 AM 04/05/2023    1:42 PM  Fall Risk   Falls in the past year? 0 0 0 0 0  Number falls in past yr: 0 0 0 0 0  Injury with Fall? 0 0 0 0 0  Risk for fall due to : Medication side effect  No Fall  Risks No Fall Risks Medication side effect  Follow up Falls evaluation completed;Falls prevention discussed Falls evaluation completed Falls prevention discussed Falls prevention discussed Falls prevention discussed;Falls evaluation completed    MEDICARE RISK AT HOME:  Medicare Risk at Home Any stairs in or around the home?: Yes If so, are there any without handrails?: No Home free of loose throw rugs in walkways, pet beds, electrical cords, etc?: Yes Adequate lighting in your home to reduce risk of falls?: Yes Life alert?: No Use of a cane, walker or w/c?: No Grab bars in the bathroom?: No Shower chair or bench in shower?: No Elevated toilet seat or a handicapped toilet?: No  TIMED UP AND GO:  Was the test performed?  No  Cognitive Function: Unable: Due to language barrier, hearing or vision limitations or otherneeds interpreter.    02/26/2024   12:00 PM  MMSE - Mini Mental State Exam  Orientation to time 5  Orientation to Place 5  Registration 3  Attention/ Calculation 0  Recall 3  Language- name 2 objects 2  Language- repeat 1  Language- follow 3 step command 2  Language- read & follow direction 1  Write a sentence 1  Copy design 1  Total score 24        Immunizations Immunization History  Administered Date(s) Administered   PNEUMOCOCCAL CONJUGATE-20 02/01/2023   Zoster Recombinant(Shingrix ) 02/01/2023    Screening Tests Health Maintenance  Topic Date Due   DTaP/Tdap/Td (1 - Tdap) Never done   DEXA SCAN  Never done   Zoster Vaccines- Shingrix  (2 of 2) 03/29/2023   COVID-19 Vaccine (1 - 2024-25 season) Never done   MAMMOGRAM  07/12/2023   INFLUENZA VACCINE  05/01/2024   Medicare Annual Wellness (AWV)  04/06/2025   Colonoscopy  03/29/2029   Pneumococcal Vaccine: 50+ Years  Completed   Hepatitis C Screening  Completed   Hepatitis B Vaccines  Aged Out   HPV VACCINES  Aged Out   Meningococcal B Vaccine  Aged Out    Health Maintenance  Health  Maintenance Due  Topic Date Due   DTaP/Tdap/Td (1 - Tdap) Never done   DEXA SCAN  Never done   Zoster Vaccines- Shingrix  (2 of 2) 03/29/2023   COVID-19 Vaccine (1 - 2024-25 season) Never done   MAMMOGRAM  07/12/2023   Health Maintenance Items Addressed: Mammogram ordered, DEXA ordered, Declines covid vaccine  Additional Screening:  Vision Screening: Recommended annual ophthalmology exams for early detection of glaucoma and other disorders of the eye. Would you like a referral to an eye doctor? No    Dental Screening: Recommended annual dental exams for proper oral hygiene  Community Resource Referral / Chronic Care Management: CRR required this visit?  No   CCM required this visit?  No   Plan:    I have personally reviewed and noted the following in the patient's chart:   Medical and social history Use of alcohol, tobacco or illicit drugs  Current medications and supplements including opioid prescriptions. Patient is not currently taking opioid prescriptions. Functional ability and status Nutritional status Physical activity Advanced directives List of other physicians Hospitalizations, surgeries, and ER visits in previous 12 months Vitals Screenings to include cognitive, depression, and falls Referrals and appointments  In addition, I have reviewed and discussed with patient certain preventive protocols, quality metrics, and best practice recommendations. A written personalized care plan for preventive services as well as general preventive health recommendations were provided to patient.   Ardella FORBES Dawn, LPN   11/02/7972   After Visit Summary: (Pick Up) Due to this being a telephonic visit, with patients personalized plan was offered to patient and patient has requested to Pick up at office.  Notes: Nothing significant to report at this time.

## 2024-04-06 NOTE — Patient Instructions (Signed)
 Michelle Valenzuela , Thank you for taking time out of your busy schedule to complete your Annual Wellness Visit with me. I enjoyed our conversation and look forward to speaking with you again next year. I, as well as your care team,  appreciate your ongoing commitment to your health goals. Please review the following plan we discussed and let me know if I can assist you in the future. Your Game plan/ To Do List    Referrals: If you haven't heard from the office you've been referred to, please reach out to them at the phone provided.  You have an order for:  []   2D Mammogram  [x]   3D Mammogram  [x]   Bone Density     Please call for appointment:  [facility] [address] [place], [place] [address] [phone]     Make sure to wear two-piece clothing.  No lotions, powders, or deodorants the day of the appointment. Make sure to bring picture ID and insurance card.  Bring list of medications you are currently taking including any supplements.   Follow up Visits: Next Medicare AWV with our clinical staff: 04/09/2025 at 1:40   Have you seen your provider in the last 6 months (3 months if uncontrolled diabetes)? Yes Next Office Visit with your provider: will call to schedule  Clinician Recommendations:  Aim for 30 minutes of exercise or brisk walking, 6-8 glasses of water, and 5 servings of fruits and vegetables each day.       This is a list of the screening recommended for you and due dates:  Health Maintenance  Topic Date Due   DTaP/Tdap/Td vaccine (1 - Tdap) Never done   DEXA scan (bone density measurement)  Never done   Zoster (Shingles) Vaccine (2 of 2) 03/29/2023   COVID-19 Vaccine (1 - 2024-25 season) Never done   Mammogram  07/12/2023   Flu Shot  05/01/2024   Medicare Annual Wellness Visit  04/06/2025   Colon Cancer Screening  03/29/2029   Pneumococcal Vaccine for age over 72  Completed   Hepatitis C Screening  Completed   Hepatitis B Vaccine  Aged  Out   HPV Vaccine  Aged Out   Meningitis B Vaccine  Aged Out    Advanced directives: (ACP Link)Information on Advanced Care Planning can be found at   Best boy Advance Health Care Directives Advance Health Care Directives. [email or web address]  Advance Care Planning is important because it:  [x]  Makes sure you receive the medical care that is consistent with your values, goals, and preferences  [x]  It provides guidance to your family and loved ones and reduces their decisional burden about whether or not they are making the right decisions based on your wishes.  Follow the link provided in your after visit summary or read over the paperwork we have mailed to you to help you started getting your Advance Directives in place. If you need assistance in completing these, please reach out to us  so that we can help you!  See attachments for Preventive Care and Fall Prevention Tips.

## 2024-04-08 ENCOUNTER — Ambulatory Visit
Admission: RE | Admit: 2024-04-08 | Discharge: 2024-04-08 | Disposition: A | Discharge status: Home / Self Care | Source: Ambulatory Visit | Attending: Neurology | Admitting: Neurology

## 2024-04-08 DIAGNOSIS — R42 Dizziness and giddiness: Secondary | ICD-10-CM | POA: Diagnosis not present

## 2024-04-08 DIAGNOSIS — R413 Other amnesia: Secondary | ICD-10-CM

## 2024-05-06 ENCOUNTER — Ambulatory Visit: Payer: 59 | Admitting: Obstetrics and Gynecology

## 2024-05-25 ENCOUNTER — Ambulatory Visit: Payer: Self-pay

## 2024-05-25 NOTE — Telephone Encounter (Signed)
 FYI Only or Action Required?: Action required by provider: pt would like to be worked into PCP schedule after 4pm.  Patient was last seen in primary care on 12/24/2023 by Billy Knee, FNP.  Called Nurse Triage reporting Dysuria.  Symptoms began a week ago.  Interventions attempted: Nothing.  Symptoms are: unchanged.  Triage Disposition: See Physician Within 24 Hours  Patient/caregiver understands and will follow disposition?: Yes   Copied from CRM #[id number]. Topic: Clinical - Red Word Triage >> May 25, 2024 12:00 PM Leah C wrote: Red Word that prompted transfer to Nurse Triage: Burning with urination and hip pain. Patient did take antibiotics and still dealing with issues. It comes and goes but currently dealing with a lot of pain and burning. Reason for Disposition  Urinating more frequently than usual (i.e., frequency) OR new-onset of the feeling of an urgent need to urinate (i.e., urgency)  Answer Assessment - Initial Assessment Questions 1. SYMPTOM: What's the main symptom you're concerned about? (e.g., frequency, incontinence)     Pain with urination, 2. ONSET: When did the    start?     C week 3. PAIN: Is there any pain? If Yes, ask: How bad is it? (Scale: 1-10; mild, moderate, severe)      4. CAUSE: What do you think is causing the symptoms?     Possible UTI 5. OTHER SYMPTOMS: Do you have any other symptoms? (e.g., blood in urine, fever, flank pain, pain with urination & burning       Hip pain, back pain, hip 6. PREGNANCY: Is there any chance you are pregnant? When was your last menstrual period?     No  Pt states she usually schedules an appointment with her PCP 4pm or after.  Nurse informed patient no openings -patient would like to send message to PCP about working her in this week after 4pm.  Please call pt.  Protocols used: Urinary Symptoms-A-AH

## 2024-05-29 NOTE — Telephone Encounter (Signed)
 Tried calling patient with the help of [facility] and could not leave a voice message due to it being full.

## 2024-06-03 ENCOUNTER — Ambulatory Visit: Admitting: Physician Assistant

## 2024-06-03 ENCOUNTER — Ambulatory Visit

## 2024-06-16 ENCOUNTER — Ambulatory Visit (HOSPITAL_COMMUNITY)
Admission: EM | Admit: 2024-06-16 | Discharge: 2024-06-16 | Disposition: A | Discharge status: Home / Self Care | Attending: Family Medicine | Admitting: Family Medicine

## 2024-06-16 ENCOUNTER — Encounter (HOSPITAL_COMMUNITY): Payer: Self-pay

## 2024-06-16 ENCOUNTER — Ambulatory Visit: Payer: Self-pay

## 2024-06-16 DIAGNOSIS — N3090 Cystitis, unspecified without hematuria: Secondary | ICD-10-CM | POA: Insufficient documentation

## 2024-06-16 LAB — POCT URINALYSIS DIP (MANUAL ENTRY)
Bilirubin, UA: NEGATIVE
Glucose, UA: NEGATIVE mg/dL
Nitrite, UA: NEGATIVE
Protein Ur, POC: NEGATIVE mg/dL
Spec Grav, UA: 1.015 (ref 1.010–1.025)
Urobilinogen, UA: 0.2 U/dL
pH, UA: 6 (ref 5.0–8.0)

## 2024-06-16 NOTE — Telephone Encounter (Signed)
 FYI Only or Action Required?: Action required by provider: request for appointment.  Patient was last seen in primary care on 12/24/2023 by Billy Knee, FNP.  Called Nurse Triage reporting Dysuria.  Symptoms began today.  Interventions attempted: Nothing.  Symptoms are: gradually worsening.  Triage Disposition: See HCP Within 4 Hours (Or PCP Triage)  Patient/caregiver understands and will follow disposition?: Yes  Copied from CRM #[id number]. Topic: Clinical - Red Word Triage >> Jun 16, 2024  5:03 PM Shereese L wrote: [facility] that prompted transfer to Nurse Triage: burns when she uses the bathroom, and stomach pain Reason for Disposition  [1] MILD-MODERATE pain AND [2] constant AND [3] age > 60 years  Answer Assessment - Initial Assessment Questions Advised UC/ED. Patient reports will go to UC.  Patient reports pain with urination.   Patient reports on Sunday: had severe abdominal pai, felt like contractions, gas, took gas pills, went away; she could barely eat from pain; currently does not have this pain.  1. LOCATION: Where does it hurt?      Lower abdominal pain, left side; spreads to other side, feels like contractions; not currently. Reports Pain with urination. 2. RADIATION: Does the pain shoot anywhere else? (e.g., chest, back)     Pain spreads from left to right; not currently 3. ONSET: When did the pain begin? (e.g., minutes, hours or days ago)      Sunday-abdomen pain and Today painful urination 4. SUDDEN: Gradual or sudden onset?     Sudden; felt like gas, took gas pills and made relief 5. PATTERN Does the pain come and go, or is it constant?     Constant pain 6. SEVERITY: How bad is the pain?  (e.g., Scale 1-10; mild, moderate, or severe)     No pain now, when eating gets cramping sensation; having heartburn. It was worse Sunday, but now no pain.   9. RELIEVING/AGGRAVATING FACTORS: What makes it better or worse? (e.g., antacids, bending or twisting  motion, bowel movement)     Urination make it worse 10. OTHER SYMPTOMS: Do you have any other symptoms? (e.g., back pain, diarrhea, fever, urination pain, vomiting) Pain with urination       Denies n/v/d, fever, chills, chest pain, dizziness/ faint, HA, blood in urine/stool, vaginal discharge Last BM-this morning,  Protocols used: Abdominal Pain - [facility]

## 2024-06-16 NOTE — ED Triage Notes (Addendum)
 Pt states burning with urination and lower back pain for the past 3 days.

## 2024-06-17 ENCOUNTER — Telehealth: Payer: Self-pay | Admitting: Family Medicine

## 2024-06-17 LAB — URINE CULTURE: Culture: <10000 — AB

## 2024-06-17 MED ORDER — CEPHALEXIN 500 MG PO CAPS: 500.0000 mg | ORAL_CAPSULE | Freq: Two times a day (BID) | ORAL | 0 refills | Status: DC

## 2024-06-17 NOTE — ED Provider Notes (Signed)
 [facility]    ASSESSMENT & PLAN:  1. Cystitis    See phone note. Rx Keflex  sent in; forgot to send at time of visit.  No signs of pyelonephritis. Urine culture sent. Will notify patient of any significant results. Ensure proper hydration. Will follow up with her PCP or here if not showing improvement over the next 48 hours, sooner if needed.  Outlined signs and symptoms indicating need for more acute intervention. Patient verbalized understanding. After Visit Summary given.  SUBJECTIVE: History from pt; in-person spanish interpreter here. Michelle Valenzuela is a 73 y.o. female who complains of urinary frequency, urgency and dysuria for the past 2-3 days. Without associated flank pain, fever, chills, vaginal discharge or bleeding. Gross hematuria: not present. No specific aggravating or alleviating factors reported. No LE edema. Normal PO intake without n/v/d. Without specific abdominal pain. Ambulatory without difficulty. OTC treatment: none. H/O UTI: occasional. LMP: No LMP recorded (lmp unknown). Patient is postmenopausal.  OBJECTIVE:  Vitals:   06/16/24 2000  BP: 121/77  Pulse: 76  Resp: 16  Temp: 98 F (36.7 C)  TempSrc: Oral  SpO2: 97%   General appearance: alert; no distress Skin: warm and dry Neurologic: normal gait Psychological: alert and cooperative; normal mood and affect  Labs Reviewed  POCT URINALYSIS DIP (MANUAL ENTRY) - Abnormal; Notable for the following components:      Result Value   Ketones, POC UA trace (5) (*)    Blood, UA trace-intact (*)    Leukocytes, UA Small (1+) (*)    All other components within normal limits  URINE CULTURE    No Known Allergies  Past Medical History:  Diagnosis Date   Anxiety    Arthritis    Cataracts, bilateral    MD just watching    Hemorrhoids    HSV infection    Thyroid  disease    Social History   Socioeconomic History   Marital status: Married    Spouse name: Not on file    Number of children: 4   Years of education: Not on file   Highest education level: Bachelor's degree (e.g., BA, AB, BS)  Occupational History   Occupation: retired  Tobacco Use   Smoking status: Never   Smokeless tobacco: Never  Vaping Use   Vaping status: Never Used  Substance and Sexual Activity   Alcohol use: Never   Drug use: Never   Sexual activity: Yes    Birth control/protection: Post-menopausal  Other Topics Concern   Not on file  Social History Narrative   Are you right handed or left handed? Right    Are you currently employed ? no   What is your current occupation? Retired    Do you live at home alone? No    Who lives with you? With husband    What type of home do you live in: 1 story or 2 story? 1 story       Social Drivers of Corporate investment banker Strain: Low Risk  (04/06/2024)   Overall Financial Resource Strain (CARDIA)    Difficulty of Paying Living Expenses: Not hard at all  Food Insecurity: No Food Insecurity (04/06/2024)   Hunger Vital Sign    Worried About Running Out of Food in the Last Year: Never true    Ran Out of Food in the Last Year: Never true  Transportation Needs: No Transportation Needs (04/06/2024)   PRAPARE - Administrator, Civil Service (Medical): No  Lack of Transportation (Non-Medical): No  Physical Activity: Sufficiently Active (04/06/2024)   Exercise Vital Sign    Days of Exercise per Week: 7 days    Minutes of Exercise per Session: 60 min  Stress: No Stress Concern Present (04/06/2024)   [employer] of Occupational Health - Occupational Stress Questionnaire    Feeling of Stress: Only a little  Social Connections: Moderately Integrated (04/06/2024)   Social Connection and Isolation Panel    Frequency of Communication with Friends and Family: More than three times a week    Frequency of Social Gatherings with Friends and Family: Once a week    Attends Religious Services: More than 4 times per year    Active Member  of [employer] or Organizations: No    Attends Banker Meetings: Never    Marital Status: Married  Catering manager Violence: Not At Risk (04/06/2024)   Humiliation, Afraid, Rape, and Kick questionnaire    Fear of Current or Ex-Partner: No    Emotionally Abused: No    Physically Abused: No    Sexually Abused: No   Family History  Problem Relation Age of Onset   Heart attack Father    Heart attack Paternal Grandmother    Kidney Stones Son    Diabetes Neg Hx    Hypertension Neg Hx    Colon cancer Neg Hx    Rectal cancer Neg Hx    Stomach cancer Neg Hx    Colon polyps Neg Hx    Esophageal cancer Neg Hx         Rolinda Rogue, MD 06/17/24 [phone]

## 2024-06-17 NOTE — Telephone Encounter (Signed)
 Doing note this morning. Looks like I did not send in Keflex .  Just sent now: Meds ordered this encounter  Medications   cephALEXin  (KEFLEX ) 500 MG capsule    Sig: Take 1 capsule (500 mg total) by mouth 2 (two) times daily.    Dispense:  10 capsule    Refill:  0

## 2024-06-23 ENCOUNTER — Ambulatory Visit: Admitting: Nurse Practitioner

## 2024-07-02 ENCOUNTER — Ambulatory Visit: Admitting: Obstetrics and Gynecology

## 2024-07-21 ENCOUNTER — Telehealth: Payer: Self-pay | Admitting: Nurse Practitioner

## 2024-07-21 DIAGNOSIS — E2839 Other primary ovarian failure: Secondary | ICD-10-CM

## 2024-07-21 NOTE — Telephone Encounter (Signed)
 Please reorder Dexa scan at [facility]. [facility] is no longer scheduling Dexa/Bone Density scans. Please route message back to Admin team to contact patient for scheduling.

## 2024-07-22 ENCOUNTER — Encounter: Payer: Self-pay | Admitting: Internal Medicine

## 2024-07-22 ENCOUNTER — Ambulatory Visit: Admitting: Internal Medicine

## 2024-07-22 ENCOUNTER — Other Ambulatory Visit

## 2024-07-22 VITALS — BP 120/84 | HR 84 | Ht 59.06 in | Wt 166.0 lb

## 2024-07-22 DIAGNOSIS — E042 Nontoxic multinodular goiter: Secondary | ICD-10-CM | POA: Diagnosis not present

## 2024-07-22 DIAGNOSIS — E0590 Thyrotoxicosis, unspecified without thyrotoxic crisis or storm: Secondary | ICD-10-CM | POA: Diagnosis not present

## 2024-07-22 LAB — T3, FREE: T3, Free: 3.8 pg/mL (ref 2.3–4.2)

## 2024-07-22 LAB — TSH: TSH: 0.93 m[IU]/L (ref 0.40–4.50)

## 2024-07-22 LAB — T4, FREE: Free T4: 1.4 ng/dL (ref 0.8–1.8)

## 2024-07-22 NOTE — Progress Notes (Unsigned)
 Name: Michelle Valenzuela  MRN/ DOB: [id number], 1950-12-09    Age/ Sex: 73 y.o., female    PCP: Katheen Roselie Rockford, NP   Reason for Endocrinology Evaluation: Hyperthyroidism     Date of Initial Endocrinology Evaluation: 07/23/2022    HPI: Michelle Valenzuela is a 73 y.o. female with a past medical history of Hyperthyroidism. The patient presented for initial endocrinology clinic visit on 07/23/2022 for consultative assistance with her Hyperthyroidism.   Pt has been diagnosed with hyperthyroidism in 2012 .  She has been on methimazole  since her diagnosis  She has a history of MNG with FNA , this was done in [place], these records are not available.  Prior TRAb negative  She was [facility] by [facility], prior to that she saw me twice in 2020, at the time I had ordered thyroid  uptake and scan but this was not done   Thyroid  ultrasound 08/2022 revealed  multinodular goiter, she is s/p benign FNA of the left thyroid  nodule  SUBJECTIVE:    Today (07/23/24): Michelle Valenzuela is here for follow-up on hyperthyroidism and multinodular goiter  I have ordered her thyroid  ultrasound twice, and she has not been able to have this done yet Patient is accompanied by an interpreter Weight stable  She is c/o cough and sore throat , not OTC medications. She attributes this to her thyroid   Pt states she hasn't heard about scheduling an ultrasound  No palpitations  No tremor  Has mild anxiety  No constipation or diarrhea  She is moving to [place]  this Friday   Methimazole  5 mg, 1 tabs daily     HISTORY:  Past Medical History:  Past Medical History:  Diagnosis Date   Anxiety    Arthritis    Cataracts, bilateral    MD just watching    Hemorrhoids    HSV infection    Thyroid  disease    Past Surgical History:  Past Surgical History:  Procedure Laterality Date   CHOLECYSTECTOMY N/A 06/27/2021   Procedure: LAPAROSCOPIC CHOLECYSTECTOMY;  Surgeon: Kinsinger,  Herlene Righter, MD;  Location: [facility] OR;  Service: General;  Laterality: N/A;   COLONOSCOPY  03/29/2022   HEMORRHOID SURGERY     OVARIAN CYST SURGERY     multiple cysts   UPPER GASTROINTESTINAL ENDOSCOPY  03/29/2022    Social History:  reports that she has never smoked. She has never used smokeless tobacco. She reports that she does not drink alcohol and does not use drugs. Family History: family history includes Heart attack in her father and paternal grandmother; Kidney Stones in her son.   HOME MEDICATIONS: Allergies as of 07/22/2024   No Known Allergies      Medication List        Accurate as of July 22, 2024 11:59 PM. If you have any questions, ask your nurse or doctor.          cephALEXin  500 MG capsule Commonly known as: KEFLEX  Take 1 capsule (500 mg total) by mouth 2 (two) times daily.   methimazole  5 MG tablet Commonly known as: TAPAZOLE  Take 1 tablet (5 mg total) by mouth daily.          REVIEW OF SYSTEMS: A comprehensive ROS was conducted with the patient and is negative except as per HPI     OBJECTIVE:  VS: BP 120/84 (BP Location: Left Arm, Patient Position: Sitting, Cuff Size: Normal)   Pulse 84   Ht 4' 11.06 (1.5 m)   Wt  166 lb (75.3 kg)   LMP  (LMP Unknown)   SpO2 99%   BMI 33.46 kg/m     Wt Readings from Last 3 Encounters:  07/22/24 166 lb (75.3 kg)  02/26/24 169 lb 9.6 oz (76.9 kg)  01/27/24 168 lb 3.2 oz (76.3 kg)     EXAM: General: Pt appears well and is in NAD Pharyngeal erythema noted  Neck: General: Supple without adenopathy. Thyroid : Unable to palpate any thyroid  nodules on today's exam  Lungs: Clear with good BS bilat   Heart: Auscultation: RRR.  Mental Status: Judgment, insight: Intact Orientation: Oriented to time, place, and person Mood and affect: No depression, anxiety, or agitation     DATA REVIEWED:   Latest Reference Range & Units 07/22/24 13:46  TSH 0.40 - 4.50 mIU/L 0.93  Triiodothyronine,Free,Serum 2.3 -  4.2 pg/mL 3.8  T4,Free(Direct) 0.8 - 1.8 ng/dL 1.4    Thyroid  ultrasound 08/09/2022  Estimated total number of nodules >/= 1 cm: 1   Number of spongiform nodules >/=  2 cm not described below (TR1): 0   Number of mixed cystic and solid nodules >/= 1.5 cm not described below (TR2): 0   _________________________________________________________   There is a 0.7 cm spongiform nodule in the left superior thyroid  which appears benign and does not warrant additional follow-up.   Nodule # 2:   Location: Left; Inferior   Maximum size: 4.0 cm; Other 2 dimensions: 2.5 x 2.0 cm   Composition: solid/almost completely solid (2)   Echogenicity: isoechoic (1)   Shape: not taller-than-wide (0)   Margins: ill-defined (0)   Echogenic foci: none (0)   ACR TI-RADS total points: 3.   ACR TI-RADS risk category: TR3 (3 points).   ACR TI-RADS recommendations:   **Given size (>/= 2.5 cm) and appearance, fine needle aspiration of this mildly suspicious nodule should be considered based on TI-RADS criteria.   _________________________________________________________   No cervical lymphadenopathy.   IMPRESSION: Solid nodule at the left inferior thyroid  (labeled 2, 4.0 cm) meets criteria (TI-RADS category 3) for tissue sampling. Recommend ultrasound-guided fine-needle aspiration.   FNA left nodule 10/04/2022   Clinical History: Left inferior 4.0cm; Other 2 dimensions: 2.5 x 2.0cm, Solid / almost completely solid, Isoechoic, TI-RADS total points 3 Specimen Submitted:  A. THYROID , LEFT INFERIOR, FINE NEEDLE ASPIRATION:   FINAL MICROSCOPIC DIAGNOSIS: - Consistent with benign follicular nodule (Bethesda category II) - See diagnostic comment      ASSESSMENT/PLAN/RECOMMENDATIONS:   Hyperthyroidism:  -Patient is clinically euthyroid -TRAb negative  -This is most likely due to toxic thyroid  nodule - TFTs remain within normal range, no change   Medications :  Continue  methimazole  5 mg, 1 tablet daily   2. MNG:  - No local neck symptoms  -She endorses benign FNA of the thyroid  while living in [place] years ago -She is S/P benign FNA of the left inferior thyroid  nodule 1//2024 - She has not had a thyroid  ultrasound despite placing the order for it twice, patient denies receiving any calls to schedule, unfortunately she is moving to [place]  in 2 days and will not be able to have this done here - Patient urged to establish with PCP/endocrinology as soon as she moves to [place]    3. URI :  -Reassurance provided - Patient to use OTC cold medicine such as DayQuil    Patient will be moving to [place]   Signed electronically by: Stefano Redgie Butts, MD  [facility]  [facility] [address]  [address] [place], [place] [address] Phone: [phone] FAX: [phone]   CC: Katheen Roselie Rockford, NP [address] [place] [address] Phone: [phone] Fax: [phone]   Return to Endocrinology clinic as below: Future Appointments  Date Time Provider Department Center  10/12/2024  2:30 PM [place], Betty G, MD [facility] [address]  04/09/2025  1:40 PM [facility]-ANNUAL WELLNESS VISIT [facility] [address]

## 2024-07-23 ENCOUNTER — Ambulatory Visit: Payer: Self-pay | Admitting: Internal Medicine

## 2024-07-23 MED ORDER — METHIMAZOLE 5 MG PO TABS: 5.0000 mg | ORAL_TABLET | Freq: Every day | ORAL | 3 refills | Status: AC

## 2024-07-28 ENCOUNTER — Ambulatory Visit: Admitting: Internal Medicine

## 2024-10-12 ENCOUNTER — Encounter: Payer: Medicare (Managed Care) | Admitting: Family Medicine

## 2025-04-09 ENCOUNTER — Ambulatory Visit
